# Patient Record
Sex: Male | Born: 1935 | Race: White | Hispanic: No | Marital: Married | Smoking: Former smoker
Health system: Southern US, Community
[De-identification: ages and names within clinical notes are randomized; demographics above are authoritative.]

## PROBLEM LIST (undated history)

## (undated) DIAGNOSIS — I10 Essential (primary) hypertension: Secondary | ICD-10-CM

## (undated) DIAGNOSIS — M4322 Fusion of spine, cervical region: Secondary | ICD-10-CM

## (undated) DIAGNOSIS — I38 Endocarditis, valve unspecified: Secondary | ICD-10-CM

## (undated) DIAGNOSIS — N281 Cyst of kidney, acquired: Secondary | ICD-10-CM

## (undated) DIAGNOSIS — M353 Polymyalgia rheumatica: Secondary | ICD-10-CM

## (undated) DIAGNOSIS — I4819 Other persistent atrial fibrillation: Secondary | ICD-10-CM

## (undated) DIAGNOSIS — C61 Malignant neoplasm of prostate: Secondary | ICD-10-CM

## (undated) HISTORY — DX: Polymyalgia rheumatica: M35.3

## (undated) HISTORY — DX: Other persistent atrial fibrillation: I48.19

## (undated) HISTORY — DX: Malignant neoplasm of prostate: C61

## (undated) HISTORY — PX: PROSTATECTOMY: SHX69

## (undated) HISTORY — DX: Cyst of kidney, acquired: N28.1

## (undated) HISTORY — PX: CERVICAL FUSION: SHX112

## (undated) HISTORY — DX: Endocarditis, valve unspecified: I38

## (undated) HISTORY — PX: HERNIA REPAIR: SHX51

---

## 1997-06-26 DIAGNOSIS — C61 Malignant neoplasm of prostate: Secondary | ICD-10-CM

## 1997-06-26 HISTORY — DX: Malignant neoplasm of prostate: C61

## 2003-06-27 DIAGNOSIS — M4322 Fusion of spine, cervical region: Secondary | ICD-10-CM

## 2003-06-27 HISTORY — DX: Fusion of spine, cervical region: M43.22

## 2014-11-02 ENCOUNTER — Other Ambulatory Visit: Payer: Self-pay | Admitting: Urology

## 2014-11-02 DIAGNOSIS — R310 Gross hematuria: Secondary | ICD-10-CM

## 2014-11-04 ENCOUNTER — Ambulatory Visit
Admission: RE | Admit: 2014-11-04 | Discharge: 2014-11-04 | Disposition: A | Discharge status: Home / Self Care | Payer: Medicare Other | Source: Ambulatory Visit | Attending: Urology | Admitting: Urology

## 2014-11-04 DIAGNOSIS — N281 Cyst of kidney, acquired: Secondary | ICD-10-CM | POA: Insufficient documentation

## 2014-11-04 DIAGNOSIS — K4090 Unilateral inguinal hernia, without obstruction or gangrene, not specified as recurrent: Secondary | ICD-10-CM | POA: Diagnosis not present

## 2014-11-04 DIAGNOSIS — R310 Gross hematuria: Secondary | ICD-10-CM | POA: Diagnosis present

## 2014-11-04 HISTORY — DX: Fusion of spine, cervical region: M43.22

## 2014-11-04 HISTORY — DX: Essential (primary) hypertension: I10

## 2014-11-04 MED ORDER — IOHEXOL 300 MG/ML  SOLN
125.0000 mL | Freq: Once | INTRAMUSCULAR | Status: AC | PRN
  Administered 2014-11-04: 150 mL via INTRAVENOUS

## 2015-10-27 ENCOUNTER — Other Ambulatory Visit: Payer: Self-pay | Admitting: Orthopedic Surgery

## 2015-10-27 DIAGNOSIS — M542 Cervicalgia: Secondary | ICD-10-CM

## 2015-10-27 DIAGNOSIS — M545 Low back pain: Secondary | ICD-10-CM

## 2015-11-15 ENCOUNTER — Ambulatory Visit
Admission: RE | Admit: 2015-11-15 | Discharge: 2015-11-15 | Disposition: A | Discharge status: Home / Self Care | Payer: Medicare Other | Source: Ambulatory Visit | Attending: Orthopedic Surgery | Admitting: Orthopedic Surgery

## 2015-11-15 DIAGNOSIS — M542 Cervicalgia: Secondary | ICD-10-CM

## 2015-11-15 DIAGNOSIS — M47816 Spondylosis without myelopathy or radiculopathy, lumbar region: Secondary | ICD-10-CM | POA: Insufficient documentation

## 2015-11-15 DIAGNOSIS — M47812 Spondylosis without myelopathy or radiculopathy, cervical region: Secondary | ICD-10-CM | POA: Diagnosis not present

## 2015-11-15 DIAGNOSIS — M545 Low back pain: Secondary | ICD-10-CM

## 2015-11-15 DIAGNOSIS — M4802 Spinal stenosis, cervical region: Secondary | ICD-10-CM | POA: Insufficient documentation

## 2015-11-15 DIAGNOSIS — M1288 Other specific arthropathies, not elsewhere classified, other specified site: Secondary | ICD-10-CM | POA: Diagnosis not present

## 2015-11-15 DIAGNOSIS — S129XXA Fracture of neck, unspecified, initial encounter: Secondary | ICD-10-CM | POA: Diagnosis not present

## 2015-11-15 DIAGNOSIS — M47814 Spondylosis without myelopathy or radiculopathy, thoracic region: Secondary | ICD-10-CM | POA: Insufficient documentation

## 2015-11-15 DIAGNOSIS — M546 Pain in thoracic spine: Secondary | ICD-10-CM | POA: Insufficient documentation

## 2016-11-06 ENCOUNTER — Other Ambulatory Visit: Payer: Self-pay | Admitting: Physical Medicine and Rehabilitation

## 2016-11-06 ENCOUNTER — Ambulatory Visit
Admission: RE | Admit: 2016-11-06 | Discharge: 2016-11-06 | Disposition: A | Discharge status: Home / Self Care | Payer: Medicare Other | Source: Ambulatory Visit | Attending: Physical Medicine and Rehabilitation | Admitting: Physical Medicine and Rehabilitation

## 2016-11-06 DIAGNOSIS — M4302 Spondylolysis, cervical region: Secondary | ICD-10-CM | POA: Insufficient documentation

## 2016-11-06 DIAGNOSIS — Z981 Arthrodesis status: Secondary | ICD-10-CM | POA: Insufficient documentation

## 2016-11-06 DIAGNOSIS — M542 Cervicalgia: Secondary | ICD-10-CM

## 2016-11-06 DIAGNOSIS — M4802 Spinal stenosis, cervical region: Secondary | ICD-10-CM | POA: Insufficient documentation

## 2016-11-06 DIAGNOSIS — R531 Weakness: Secondary | ICD-10-CM | POA: Diagnosis not present

## 2016-11-28 ENCOUNTER — Other Ambulatory Visit: Payer: Self-pay | Admitting: Orthopedic Surgery

## 2016-11-28 DIAGNOSIS — M67912 Unspecified disorder of synovium and tendon, left shoulder: Secondary | ICD-10-CM

## 2016-12-05 ENCOUNTER — Ambulatory Visit
Admission: RE | Admit: 2016-12-05 | Discharge: 2016-12-05 | Disposition: A | Discharge status: Home / Self Care | Payer: Medicare Other | Source: Ambulatory Visit | Attending: Orthopedic Surgery | Admitting: Orthopedic Surgery

## 2016-12-05 DIAGNOSIS — M25712 Osteophyte, left shoulder: Secondary | ICD-10-CM | POA: Insufficient documentation

## 2016-12-05 DIAGNOSIS — M19012 Primary osteoarthritis, left shoulder: Secondary | ICD-10-CM | POA: Insufficient documentation

## 2016-12-05 DIAGNOSIS — M67912 Unspecified disorder of synovium and tendon, left shoulder: Secondary | ICD-10-CM | POA: Diagnosis present

## 2017-05-19 IMAGING — MR MR LUMBAR SPINE W/O CM
4 of 5 series · 15 of 48 positions shown · non-contrast
Comparison: CT of the abdomen 11/04/2014.

CLINICAL DATA: Low back pain. RIGHT leg pain. Symptoms for many
years, now worsening.

EXAM:
MRI LUMBAR SPINE WITHOUT CONTRAST
TECHNIQUE: Multiplanar, multisequence MR imaging of the lumbar spine was
performed. No intravenous contrast was administered.

[Series 2: T2 · sagittal · 4.0mm · 0.44mm/px · 6 of 16 slices shown (1 of 2)]
[im 1/16]
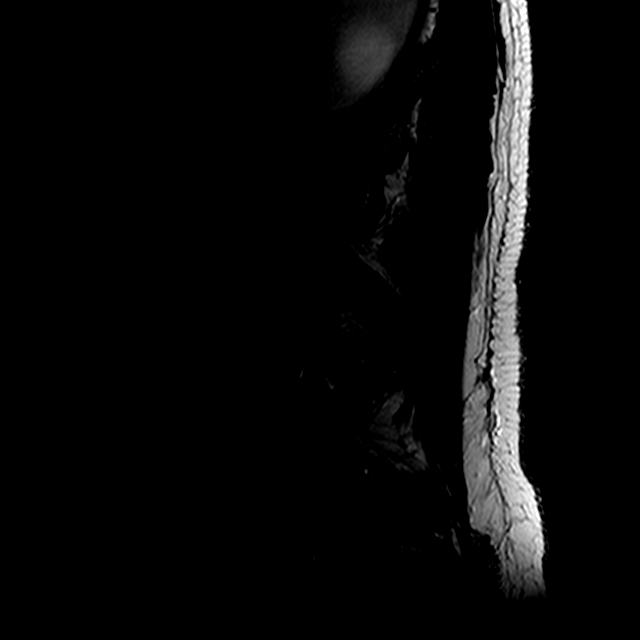
[im 4/16]
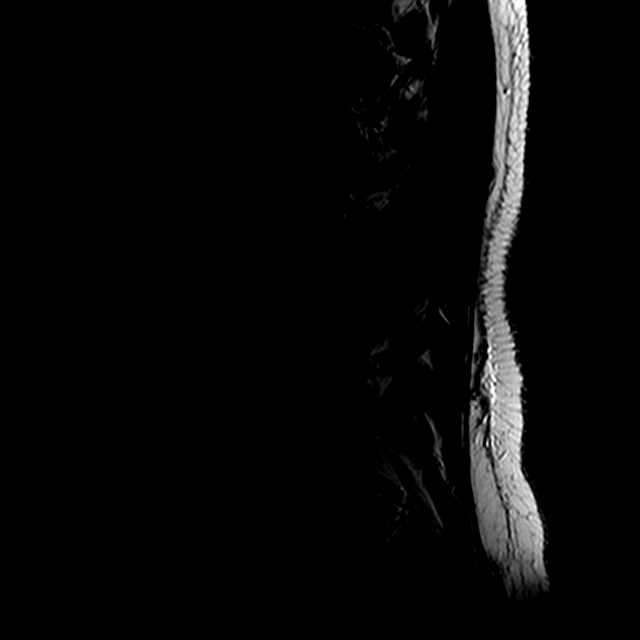
[im 7/16]
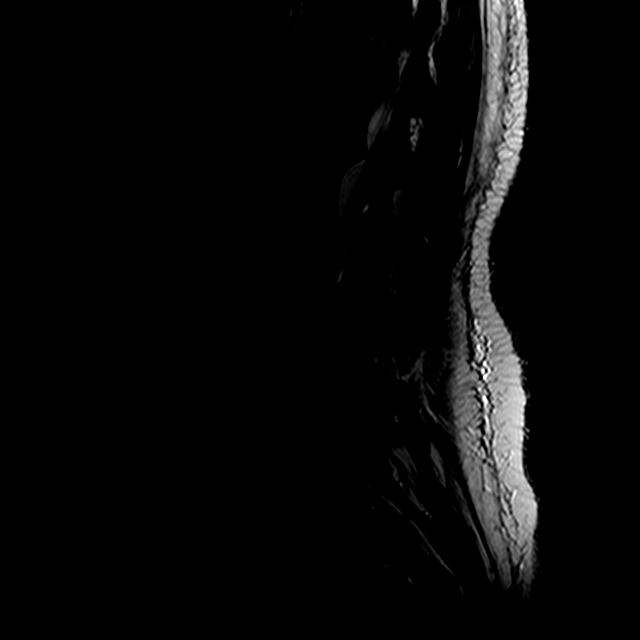
[im 10/16]
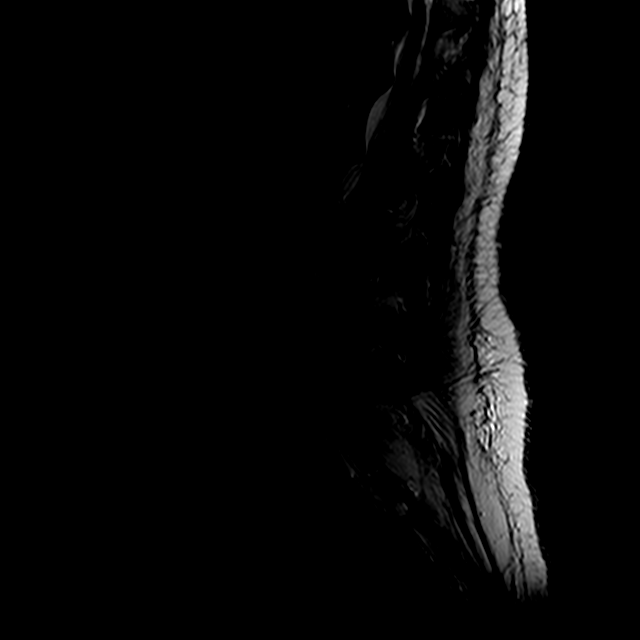
[im 13/16]
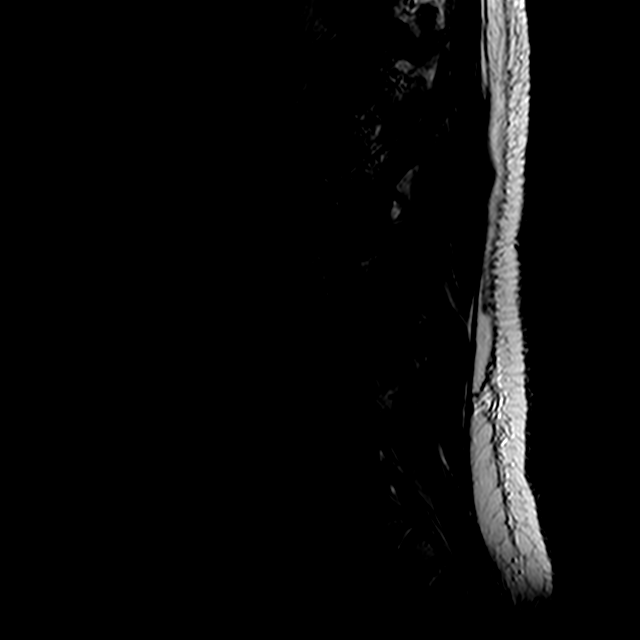
[im 16/16]
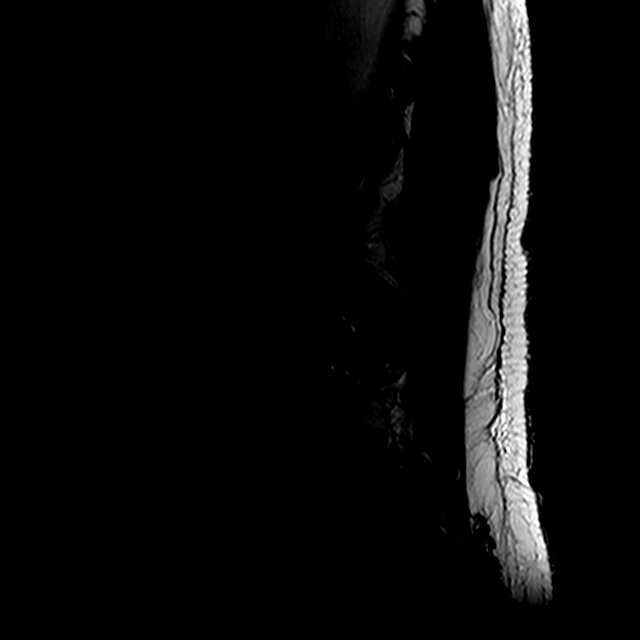

[Series 3: T1 · sagittal · 4.0mm · 0.44mm/px · 3 of 16 slices shown (1 of 2)]
[im 4/16]
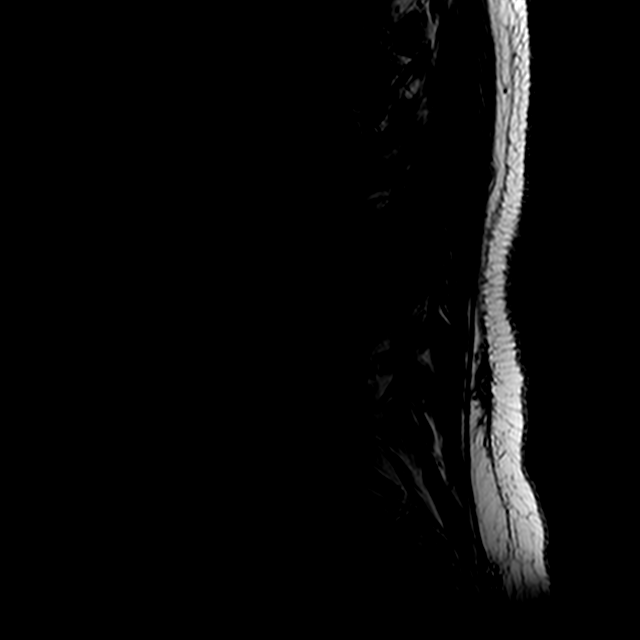
[im 10/16]
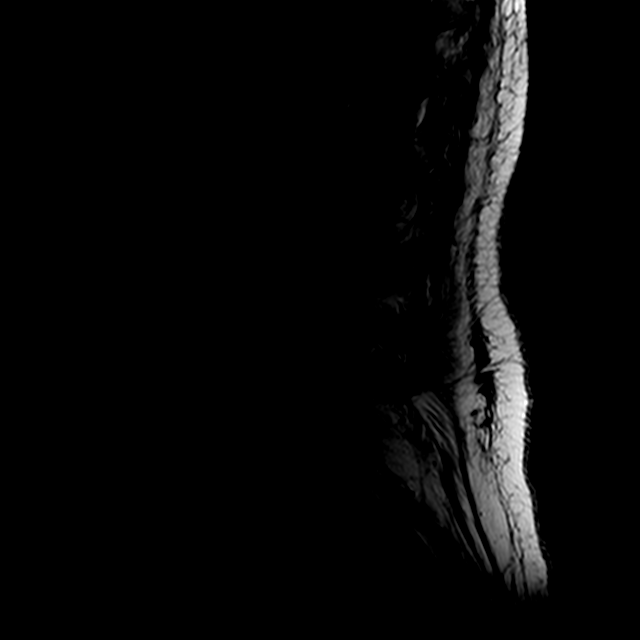
[im 16/16]
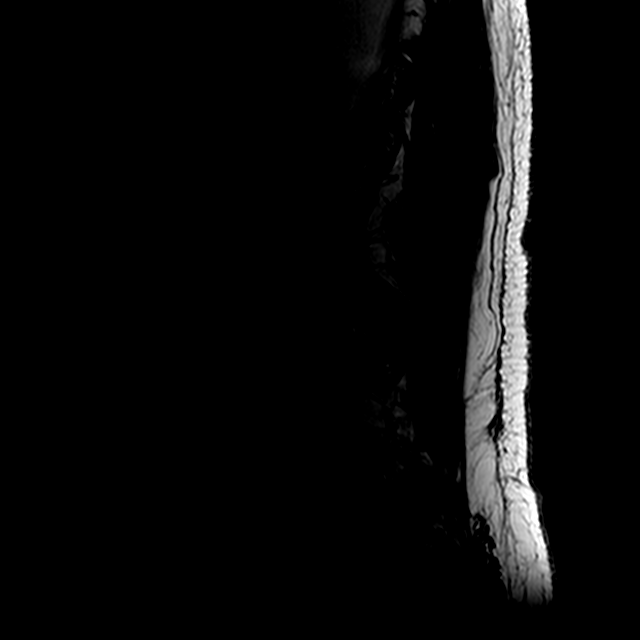

[Series 5: T2 · axial · 4.0mm · 0.39mm/px · z∈[-41,+131]mm · 3 of 38 slices shown (2 of 2)]
[im 6/38]
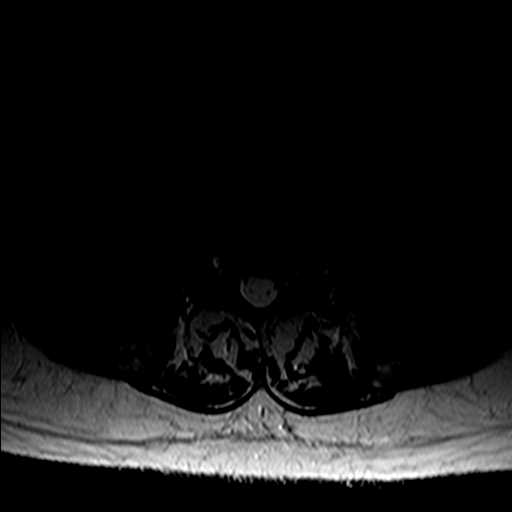
[im 19/38]
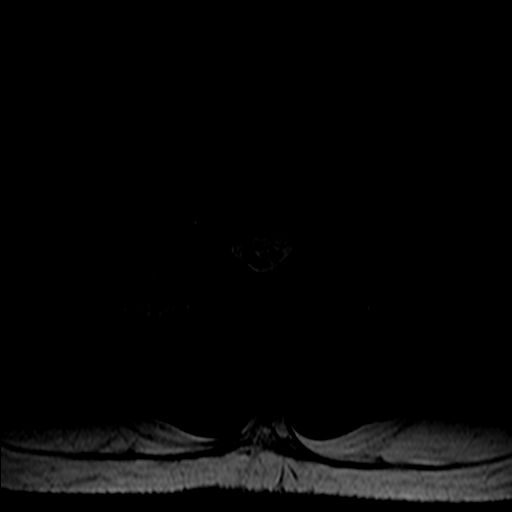
[im 32/38]
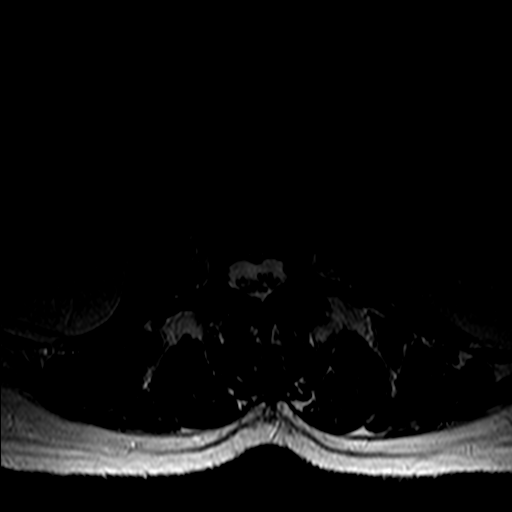

[Series 6: T1 · axial · 4.0mm · 0.39mm/px · z∈[-41,+131]mm · 3 of 38 slices shown (2 of 2)]
[im 6/38]
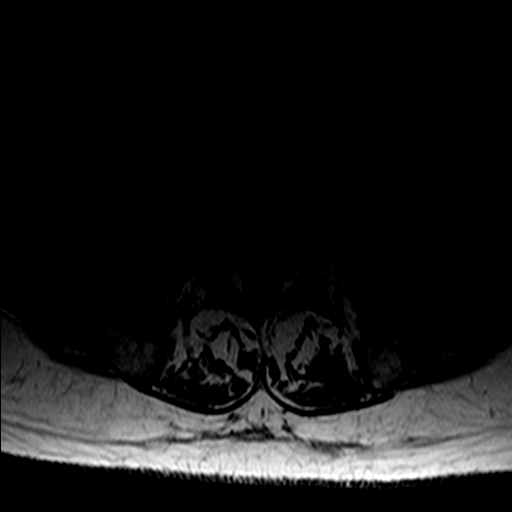
[im 19/38]
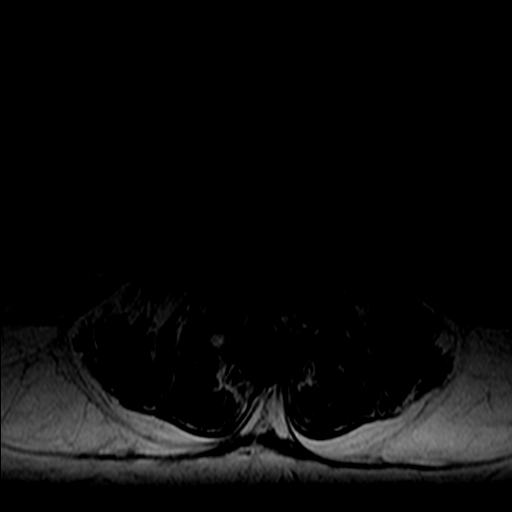
[im 32/38]
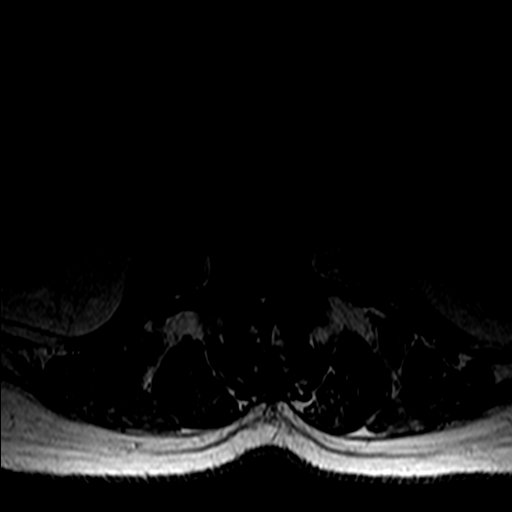

[15 of 48 positions shown; findings below may reference images not displayed]

FINDINGS: Segmentation:  Normal.

Alignment:  3 mm anterolisthesis at L5-S1 is facet mediated.

Vertebrae: No worrisome osseous lesion. Schmorl's nodes project
inferiorly from L1-2 and superiorly from L2-3.

Conus medullaris: Extends to the L1 level and appears normal.

Paraspinal and other soft tissues: Unremarkable

Disc levels:

L1-L2: Mild disc space narrowing. Central protrusion. Posterior
element hypertrophy. Mild stenosis. No definite subarticular zone or
foraminal zone narrowing.

L2-L3: Mild disc space narrowing. Central protrusion. Advanced
posterior element hypertrophy. Severe stenosis. LEFT greater than
RIGHT subarticular zone narrowing. Mild BILATERAL foraminal
narrowing.

L3-L4: Mild disc space narrowing. Annular bulging. Posterior element
hypertrophy. Moderate to severe stenosis. BILATERAL subarticular
zone and foraminal zone narrowing.

L4-L5: Central protrusion. Posterior element hypertrophy. Mild
stenosis. RIGHT greater than LEFT subarticular zone and foraminal
zone narrowing.

L5-S1: 3 mm anterolisthesis. Annular bulging. Marked posterior
element hypertrophy. No subarticular zone narrowing, but BILATERAL
foraminal narrowing is prominent, worse on the RIGHT.
IMPRESSION: Multilevel spondylosis as described.

With regard to RIGHT leg symptoms, potentially symptomatic neural
impingement is observed at multiple levels, from L2-3 through L5-S1.

## 2018-01-11 ENCOUNTER — Encounter: Payer: Self-pay | Admitting: Cardiovascular Disease

## 2018-01-11 ENCOUNTER — Telehealth: Payer: Self-pay | Admitting: Cardiovascular Disease

## 2018-01-11 ENCOUNTER — Ambulatory Visit (INDEPENDENT_AMBULATORY_CARE_PROVIDER_SITE_OTHER): Payer: Medicare Other | Admitting: Cardiovascular Disease

## 2018-01-11 ENCOUNTER — Encounter: Payer: Self-pay | Admitting: *Deleted

## 2018-01-11 VITALS — BP 179/91 | HR 69 | Ht 71.0 in | Wt 208.5 lb

## 2018-01-11 DIAGNOSIS — I10 Essential (primary) hypertension: Secondary | ICD-10-CM

## 2018-01-11 MED ORDER — CARVEDILOL 6.25 MG PO TABS: 6.2500 mg | ORAL_TABLET | Freq: Two times a day (BID) | ORAL | 0 refills | Status: DC

## 2018-01-11 NOTE — Progress Notes (Signed)
Cardiology Office Note   Date:  01/11/2018   ID:  Ian Larsen, DOB 1934-07-27, MRN [id number]  PCP:  Antony Contras, MD  Cardiologist:   Kathlyn Sacramento, MD   Chief Complaint  Patient presents with  . OTHER    Hypertension. Meds reviewed verbally with pt.      History of Present Illness: Ian Larsen is a 83 y.o. male who was referred by Dr. Moreen Fowler for evaluation of difficult to control hypertension.  He has known history of essential hypertension, spinal stenosis, prostate cancer and borderline diabetes. He was diagnosed with essential hypertension in 1995.  For many years, his blood pressure was reasonably controlled.  However, over the last year his blood pressure became extremely high and difficult to control in spite of adding new medications.  He denies any chest pain or shortness of breath.  He is very active.  Recent labs in May showed unremarkable CBC.  Creatinine was 0.99 with a potassium of 4.5.  TSH was normal. He is currently on carvedilol, amlodipine as well as Diovan-hydrochlorothiazide.  He has history of spinal stenosis status post cervical spine surgery in 2006.  He used to take ibuprofen to control his pain but has not used these kind of medications in the last 6 to 12 months. He has no history of sleep apnea.  He was told by his wife that he snores loudly but is not aware of apnea episodes.  He feels that he has good quality sleep overall. He quit smoking about 40 years ago.  No excessive alcohol use.   Past Medical History:  Diagnosis Date  . Cancer [facility]) 1999   Prostate CA with radical prostatectomy and radical lymph node resections.   . Fusion of spine of cervical region 2005  . Hypertension     Past Surgical History:  Procedure Laterality Date  . CERVICAL FUSION    . HERNIA REPAIR    . PROSTATECTOMY       Current Outpatient Medications  Medication Sig Dispense Refill  . amLODipine (NORVASC) 5 MG tablet Take 5 mg by mouth daily.    . carvedilol  (COREG) 3.125 MG tablet Take 3.125 mg by mouth 2 (two) times daily with a meal.    . Cholecalciferol (VITAMIN D) 2000 units tablet Take 2,000 Units by mouth daily.    . Coenzyme Q10 (CO Q-10) 100 MG CAPS Take by mouth daily.    . Magnesium 100 MG CAPS Take by mouth daily.    . Misc Natural Products (OSTEO BI-FLEX JOINT SHIELD PO) Take by mouth daily.    . Multiple Vitamin (MULTIVITAMIN) capsule Take 1 capsule by mouth daily.    Marland Kitchen OLIVE LEAF PO Take by mouth daily.    . Omega-3 Fatty Acids (FISH OIL PO) Take by mouth daily.    . TURMERIC CURCUMIN PO Take by mouth daily.    . valsartan-hydrochlorothiazide (DIOVAN-HCT) 320-25 MG tablet Take 1 tablet by mouth daily.     No current facility-administered medications for this visit.     Allergies:   Tape; Vicodin [hydrocodone-acetaminophen]; and Vinegar [acetic acid]    Social History:  The patient  reports that he has quit smoking. His smoking use included cigarettes. He quit after 1.00 year of use. He has never used smokeless tobacco. He reports that he does not drink alcohol or use drugs.   Family History:  The patient's family history includes Heart Problems in his mother; Hypertension in his brother and sister.    ROS:  Please see the history of present illness.   Otherwise, review of systems are positive for none.   All other systems are reviewed and negative.    PHYSICAL EXAM: VS:  BP (!) 179/91 (BP Location: Left Arm, Patient Position: Sitting, Cuff Size: Normal)   Ht 5\' 11"  (1.803 m)   Wt 208 lb 8 oz (94.6 kg)   BMI 29.08 kg/m  , BMI Body mass index is 29.08 kg/m. GEN: Well nourished, well developed, in no acute distress  HEENT: normal  Neck: no JVD, carotid bruits, or masses Cardiac: RRR; no murmurs, rubs, or gallops,no edema  Respiratory:  clear to auscultation bilaterally, normal work of breathing GI: soft, nontender, nondistended, + BS MS: no deformity or atrophy  Skin: warm and dry, no rash Neuro:  Strength and  sensation are intact Psych: euthymic mood, full affect   EKG:  EKG is ordered today. The ekg ordered today demonstrates sinus rhythm with first-degree AV block, left axis deviation and nonspecific IVCD.   Recent Labs: No results found for requested labs within last 8760 hours.    Lipid Panel No results found for: CHOL, TRIG, HDL, CHOLHDL, VLDL, LDLCALC, LDLDIRECT    Wt Readings from Last 3 Encounters:  01/11/18 208 lb 8 oz (94.6 kg)  11/04/14 226 lb (102.5 kg)       PAD Screen 01/11/2018  Previous PAD dx? No  Previous surgical procedure? No  Pain with walking? Yes  Subsides with rest? No  Feet/toe relief with dangling? No  Painful, non-healing ulcers? No  Extremities discolored? No      ASSESSMENT AND PLAN:  1.  Refractory hypertension: The patient's blood pressure has been controlled for many years up until last year when his blood pressure became difficult to control.  This raises the possibility of secondary hypertension.  He is currently not using any NSAIDs.  There is nothing to strongly suggest sleep apnea.  Hyperaldosteronism is unlikely in his age group.  I do think we have to exclude the possibility of atherosclerotic renal artery stenosis.  Thus, I requested renal artery duplex. I increased carvedilol to 6.25 mg twice daily. If blood pressure remains elevated without explanation, I think the next step is to add spironolactone.     Disposition:   FU with me in 1 month  Signed,  Kathlyn Sacramento, MD  01/11/2018 11:54 AM    [place]

## 2018-01-11 NOTE — Telephone Encounter (Signed)
Pharmacy has been updated to [facility] for long term medications.

## 2018-01-11 NOTE — Telephone Encounter (Signed)
Pt calling to let us know he uses [facility] as his mail order

## 2018-01-11 NOTE — Patient Instructions (Signed)
Medication Instructions: INCREASE the Carvedilol to 6.25 mg twice daily  If you need a refill on your cardiac medications before your next appointment, please call your pharmacy.    Procedures/Testing: Your physician has requested that you have a renal artery duplex. During this test, an ultrasound is used to evaluate blood flow to the kidneys. Allow one hour for this exam. Do not eat after midnight the day before and avoid carbonated beverages. Take your medications as you usually do.   Follow-Up: Your physician wants you to follow-up in one month with Dr. Fletcher Anon.   Thank you for choosing [facility]!

## 2018-01-18 ENCOUNTER — Ambulatory Visit (INDEPENDENT_AMBULATORY_CARE_PROVIDER_SITE_OTHER): Payer: Medicare Other

## 2018-01-18 DIAGNOSIS — I10 Essential (primary) hypertension: Secondary | ICD-10-CM

## 2018-01-21 ENCOUNTER — Other Ambulatory Visit: Payer: Self-pay | Admitting: *Deleted

## 2018-01-21 MED ORDER — CARVEDILOL 6.25 MG PO TABS: 6.2500 mg | ORAL_TABLET | Freq: Two times a day (BID) | ORAL | 3 refills | Status: DC

## 2018-01-21 NOTE — Progress Notes (Signed)
Patient called stating that he would like for his Carvedilol to be cancelled at [facility] and sent into [facility]. This has been completed for him.

## 2018-02-26 ENCOUNTER — Encounter: Payer: Self-pay | Admitting: Nurse Practitioner

## 2018-02-26 ENCOUNTER — Ambulatory Visit: Payer: Medicare Other | Admitting: Nurse Practitioner

## 2018-02-26 VITALS — BP 162/90 | HR 66 | Ht 71.0 in | Wt 208.2 lb

## 2018-02-26 DIAGNOSIS — I10 Essential (primary) hypertension: Secondary | ICD-10-CM | POA: Diagnosis not present

## 2018-02-26 MED ORDER — AMLODIPINE BESYLATE 10 MG PO TABS: 10.0000 mg | ORAL_TABLET | Freq: Every day | ORAL | 3 refills | Status: DC

## 2018-02-26 NOTE — Patient Instructions (Signed)
Medication Instructions:  Your physician has recommended you make the following change in your medication:  1- INCREASE Amlodipine to 10 mg by mouth once a day.   Labwork: none  Testing/Procedures: none  Follow-Up: Your physician recommends that you schedule a follow-up appointment in: 2-3 [place].   Any Other Special Instructions Will Be Listed Below (If Applicable).  Your physician has requested that you regularly monitor and record your blood pressure readings and heart rate at home. Please use the same machine at about the same time each day about 2 hours after taking medications.   Please call us in 1 week with the readings.   If you need a refill on your cardiac medications before your next appointment, please call your pharmacy.

## 2018-02-26 NOTE — Progress Notes (Signed)
Office Visit    Patient Name: Ian Larsen Date of Encounter: 02/26/2018  Primary Care Provider:  Antony Contras, MD Primary Cardiologist:  Kathlyn Sacramento, MD  Chief Complaint    83 year old male with a history of spinal stenosis, prostate cancer, and borderline diabetes, who presents for follow-up related to ongoing hypertension.  Past Medical History    Past Medical History:  Diagnosis Date  . Fusion of spine of cervical region 2005  . Hypertension    a. 12/2017 Renal artery duplex: No renal artery stenosis. Nl Celiac and SMA artery findings.  . Prostate cancer ([place]) 1999   a. 1999 s/p radical prostatectomy and radical lymph node resections.   . Renal cyst, right    a. 12/2017 Renal Duplex: incidental finding of 2.3 x 2.1 cm avascular cystic area in upper pole and 7.2 x 6.2 x 6.5 cm avascular cyst in upper-posterior portion of right kidney.   Past Surgical History:  Procedure Laterality Date  . CERVICAL FUSION    . HERNIA REPAIR    . PROSTATECTOMY      Allergies  Allergies  Allergen Reactions  . Fluviral [Flu Virus Vaccine]   . Neomycin Other (See Comments)  . Tape Dermatitis  . Vicodin [Hydrocodone-Acetaminophen] Nausea And Vomiting    Patient states this made him dizzy and felt horrible.  Lyla Son [Acetic Acid] Rash    History of Present Illness    83 year old male with the above past medical history including hypertension, spinal stenosis, prostate cancer, and borderline diabetes.  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  He was diagnosed with hypertension in 1995 and notes that he had what was deemed is reasonable control with pressures in the 140s over the years but when he moved to [place], he was encouraged to get better control of his blood pressure.  He was seen by Dr. Fletcher Anon in July and at that time, his carvedilol was increased to 6.25 mg twice daily.  His blood pressure was 179/91 that day.  A renal artery  duplex was also performed and showed no evidence of renal artery stenosis.  Right renal cysts were noted, which patient says had previously been noted by nephrology in [place].  He checks his blood pressure a few times a month and has been running in the 140-150 range.  He is fairly active, exercising daily.  He does not experience chest pain, dyspnea, palpitations, PND, orthopnea, dizziness, syncope, edema, or early satiety.  Home Medications    Prior to Admission medications   Medication Sig Start Date End Date Taking? Authorizing Provider  amLODipine (NORVASC) 5 MG tablet Take 5 mg by mouth daily.    [provider]  carvedilol (COREG) 6.25 MG tablet Take 1 tablet (6.25 mg total) by mouth 2 (two) times daily with a meal. 01/21/18   Wellington Hampshire, MD  Cholecalciferol (VITAMIN D) 2000 units tablet Take 2,000 Units by mouth daily.    [provider]  Coenzyme Q10 (CO Q-10) 100 MG CAPS Take by mouth daily.    [provider]  Magnesium 100 MG CAPS Take by mouth daily.    [provider]  Misc Natural Products (OSTEO BI-FLEX JOINT SHIELD PO) Take by mouth daily.    [provider]  Multiple Vitamin (MULTIVITAMIN) capsule Take 1 capsule by mouth daily.    [provider]  OLIVE LEAF PO Take by mouth daily.    [provider]  Omega-3 Fatty Acids ([place]  OIL PO) Take by mouth daily.    [provider]  TURMERIC CURCUMIN PO Take by mouth daily.    [provider]  valsartan-hydrochlorothiazide (DIOVAN-HCT) 320-25 MG tablet Take 1 tablet by mouth daily.    [provider]    Review of Systems    He denies chest pain, palpitations, dyspnea, pnd, orthopnea, n, v, dizziness, syncope, edema, weight gain, or early satiety.  All other systems reviewed and are otherwise negative except as noted above.  Physical Exam    VS:  BP (!) 162/90 (BP Location: Left Arm, Patient Position: Sitting, Cuff Size: Large)    Pulse 66   Ht 5\' 11"  (1.803 m)   Wt 208 lb 4 oz (94.5 kg)   BMI 29.04 kg/m  , BMI Body mass index is 29.04 kg/m. GEN: Well nourished, well developed, in no acute distress. HEENT: normal. Neck: Supple, no JVD, carotid bruits, or masses. Cardiac: RRR, no murmurs, rubs, or gallops. No clubbing, cyanosis, edema.  Radials/DP/PT 2+ and equal bilaterally.  Respiratory:  Respirations regular and unlabored, clear to auscultation bilaterally. GI: Soft, nontender, nondistended, BS + x 4. MS: no deformity or atrophy. Skin: warm and dry, no rash. Neuro:  Strength and sensation are intact. Psych: Normal affect.  Accessory Clinical Findings    ECG personally reviewed by me today -regular sinus rhythm, 66, first-degree AV block, left axis deviation, left anterior fascicular block, IVCD- no acute changes.  Assessment & Plan    1.  Essential hypertension: Patient with a long history of hypertension was recently evaluated in July and carvedilol was increased.  Pressures at home have been in the 140s.  We discussed options for management today including Dr. Tyrell Antonio recommendation to add spironolactone.  Patient has a history of prostate cancer and already takes HCTZ and has some concern that additional diuretic may resulted in more polyuria and nocturia.  We have agreed to titrate amlodipine to 10 mg daily today and he will check his blood pressures daily over the next week and call us in 1 week.  If pressures are persistently greater than 130, at that point, he would be agreeable to initiating Spironolactone 12.5 mg daily.  If we go that route, he will need a follow-up blood chemistry within a week.  2.  Disposition: He will call us in 1 week with blood pressure report.  Plan to follow-up in 2 to 3 months otherwise.  Murray Hodgkins, NP 02/26/2018, 12:30 PM

## 2018-03-07 ENCOUNTER — Telehealth: Payer: Self-pay | Admitting: Cardiovascular Disease

## 2018-03-07 NOTE — Telephone Encounter (Signed)
Patient dropped off list of BP readings Placed in nurse box

## 2018-03-08 NOTE — Telephone Encounter (Signed)
Pressure trend appears to be better.  More 130's, less 140's.  As he has had some lightheadedness, I think we should hold off on adjusting any further/adding another agent at this time.  Cont current regimen and continue to record bp's.  If he start to see bp creeping back up into the 140-150 range consistently, than he should contact us, as we'd likely add spironolactone at that point.

## 2018-03-08 NOTE — Telephone Encounter (Signed)
Left a message for the patient to call back.  

## 2018-03-08 NOTE — Telephone Encounter (Signed)
Patient returning our call

## 2018-03-08 NOTE — Telephone Encounter (Signed)
Reported BP (HR) readings per patient:  01/12/18- started coreg 6.25 mg BID 7/24- 136/83 (68) 8/8- 130/76 (61) 8/18- 140/84 (64) 8/27- 149/79 (64) 8/31- 147/84 (65) 9/2- 162/90 (66) - "f/u visit at the office amlodipine went from 5>>10 mg 9/3- started amlodipine 10 mg QD 9/4- 126/79 (67) 9/5- 139/77 (64) 9/6- 137/76 (64) 9/7- 139/75 (65) 9/8- 143/88 (67) 9/9- 150/87 (60) 9/10- 136/72 (65) 9/11- 134/74 (62)  Per the patient, he had comments on his paperwork from his BP readings for September "a little light headed about 30 minute after taking passes in 20-30 minutes."  The patient saw Ignacia Bayley, NP on 02/26/18. To Gerald Stabs to review.

## 2018-03-11 NOTE — Telephone Encounter (Signed)
Patient made aware and will call back if blood pressures consistently stay in the 140-150 range.

## 2018-05-30 ENCOUNTER — Ambulatory Visit: Payer: Medicare Other | Admitting: Cardiovascular Disease

## 2018-05-30 ENCOUNTER — Encounter: Payer: Self-pay | Admitting: Cardiovascular Disease

## 2018-05-30 VITALS — BP 168/84 | HR 70 | Ht 71.0 in | Wt 214.0 lb

## 2018-05-30 DIAGNOSIS — I10 Essential (primary) hypertension: Secondary | ICD-10-CM

## 2018-05-30 MED ORDER — AMLODIPINE BESYLATE 10 MG PO TABS: 10.0000 mg | ORAL_TABLET | Freq: Every day | ORAL | 3 refills | Status: DC

## 2018-05-30 NOTE — Progress Notes (Signed)
Cardiology Office Note   Date:  05/30/2018   ID:  [name], DOB [birth year], MRN [id number]  PCP:  Antony Contras, MD  Cardiologist:   Kathlyn Sacramento, MD   Chief Complaint  Patient presents with  . other    3 mo follow up. Medications reviewed verbally.       History of Present Illness: Ian Larsen is a 83 y.o. male who is here today for follow-up visit regarding refractory hypertension.    He has known history of essential hypertension, spinal stenosis, prostate cancer and borderline diabetes. He was diagnosed with essential hypertension in 1995.  For many years, his blood pressure was reasonably controlled.  However, his blood pressure became difficult to control in 2019.   He has history of spinal stenosis status post cervical spine surgery in 2006.  He has no history of sleep apnea.  During last visit, increase his carvedilol and scheduled him for renal artery duplex which showed no evidence of renal artery stenosis.  His amlodipine was increased to 10 mg daily.  He has been doing very well with no recent chest pain, shortness of breath or palpitations.  No leg edema.  He has been checking his blood pressure regularly at home and most of the time his systolic blood pressure is below 130 mmHg.  Past Medical History:  Diagnosis Date  . Fusion of spine of cervical region 2005  . Hypertension    a. 12/2017 Renal artery duplex: No renal artery stenosis. Nl Celiac and SMA artery findings.  . Prostate cancer ([place]) 1999   a. 1999 s/p radical prostatectomy and radical lymph node resections.   . Renal cyst, right    a. 12/2017 Renal Duplex: incidental finding of 2.3 x 2.1 cm avascular cystic area in upper pole and 7.2 x 6.2 x 6.5 cm avascular cyst in upper-posterior portion of right kidney.    Past Surgical History:  Procedure Laterality Date  . CERVICAL FUSION    . HERNIA REPAIR    . PROSTATECTOMY       Current Outpatient Medications  Medication Sig Dispense Refill  .  amLODipine (NORVASC) 10 MG tablet Take 1 tablet (10 mg total) by mouth daily. 90 tablet 3  . carvedilol (COREG) 6.25 MG tablet Take 1 tablet (6.25 mg total) by mouth 2 (two) times daily with a meal. 180 tablet 3  . Cholecalciferol (VITAMIN D) 2000 units tablet Take 2,000 Units by mouth daily.    . Coenzyme Q10 (CO Q-10) 100 MG CAPS Take by mouth daily.    . Magnesium 100 MG CAPS Take by mouth daily.    . Misc Natural Products (OSTEO BI-FLEX JOINT SHIELD PO) Take by mouth daily.    . Multiple Vitamin (MULTIVITAMIN) capsule Take 1 capsule by mouth daily.    Marland Kitchen OLIVE LEAF PO Take by mouth daily.    . Omega-3 Fatty Acids (FISH OIL PO) Take by mouth daily.    . TURMERIC CURCUMIN PO Take by mouth daily.    . valsartan-hydrochlorothiazide (DIOVAN-HCT) 320-25 MG tablet Take 1 tablet by mouth daily.     No current facility-administered medications for this visit.     Allergies:   Fluviral [flu virus vaccine]; Neomycin; Tape; Vicodin [hydrocodone-acetaminophen]; and Vinegar [acetic acid]    Social History:  The patient  reports that he has quit smoking. His smoking use included cigarettes. He quit after 1.00 year of use. He has never used smokeless tobacco. He reports that he does not drink alcohol or  use drugs.   Family History:  The patient's family history includes Heart Problems in his mother; Hypertension in his brother and sister.    ROS:  Please see the history of present illness.   Otherwise, review of systems are positive for none.   All other systems are reviewed and negative.    PHYSICAL EXAM: VS:  BP (!) 168/84   Pulse 70   Ht 5\' 11"  (1.803 m)   Wt 214 lb (97.1 kg)   BMI 29.85 kg/m  , BMI Body mass index is 29.85 kg/m. GEN: Well nourished, well developed, in no acute distress  HEENT: normal  Neck: no JVD, carotid bruits, or masses Cardiac: RRR; no murmurs, rubs, or gallops,no edema  Respiratory:  clear to auscultation bilaterally, normal work of breathing GI: soft, nontender,  nondistended, + BS MS: no deformity or atrophy  Skin: warm and dry, no rash Neuro:  Strength and sensation are intact Psych: euthymic mood, full affect   EKG:  EKG is ordered today. The ekg ordered today demonstrates normal sinus rhythm with left anterior fascicular block.  Recent Labs: No results found for requested labs within last 8760 hours.    Lipid Panel No results found for: CHOL, TRIG, HDL, CHOLHDL, VLDL, LDLCALC, LDLDIRECT    Wt Readings from Last 3 Encounters:  05/30/18 214 lb (97.1 kg)  02/26/18 208 lb 4 oz (94.5 kg)  01/11/18 208 lb 8 oz (94.6 kg)       PAD Screen 01/11/2018  Previous PAD dx? No  Previous surgical procedure? No  Pain with walking? Yes  Subsides with rest? No  Feet/toe relief with dangling? No  Painful, non-healing ulcers? No  Extremities discolored? No      ASSESSMENT AND PLAN:  1.  Refractory hypertension: No evidence of secondary hypertension.  Blood pressure is much more controlled since increasing Coreg and amlodipine.   His home blood pressure readings are much better than here and he might have a component of whitecoat syndrome.  I made no changes in his medications today. Small dose spironolactone can be considered in the future if needed. I advised him to exercise at least 30 minutes daily.    Disposition:   FU with me in 6 months  Signed,  Kathlyn Sacramento, MD  05/30/2018 11:14 AM    [place]

## 2018-05-30 NOTE — Patient Instructions (Signed)

## 2018-11-21 ENCOUNTER — Telehealth: Payer: Self-pay | Admitting: Cardiovascular Disease

## 2018-11-21 ENCOUNTER — Telehealth: Payer: Self-pay

## 2018-11-21 NOTE — Telephone Encounter (Signed)
Left v/m message requesting for patient to call back to switch 11/28/2018 face to face visit with Dr. Fletcher Anon to a telehealth visit.

## 2018-11-21 NOTE — Telephone Encounter (Signed)
Virtual Visit Pre-Appointment Phone Call  "(Name), I am calling you today to discuss your upcoming appointment. We are currently trying to limit exposure to the virus that causes COVID-19 by seeing patients at home rather than in the office."  1. "What is the BEST phone number to call the day of the visit?" - include this in appointment notes  2. Do you have or have access to (through a family member/friend) a smartphone with video capability that we can use for your visit?" a. If yes - list this number in appt notes as cell (if different from BEST phone #) and list the appointment type as a VIDEO visit in appointment notes b. If no - list the appointment type as a PHONE visit in appointment notes  3. Confirm consent - "In the setting of the current Covid19 crisis, you are scheduled for a (phone or video) visit with your provider on (date) at (time).  Just as we do with many in-office visits, in order for you to participate in this visit, we must obtain consent.  If you'd like, I can send this to your mychart (if signed up) or email for you to review.  Otherwise, I can obtain your verbal consent now.  All virtual visits are billed to your insurance company just like a normal visit would be.  By agreeing to a virtual visit, we'd like you to understand that the technology does not allow for your provider to perform an examination, and thus may limit your provider's ability to fully assess your condition. If your provider identifies any concerns that need to be evaluated in person, we will make arrangements to do so.  Finally, though the technology is pretty good, we cannot assure that it will always work on either your or our end, and in the setting of a video visit, we may have to convert it to a phone-only visit.  In either situation, we cannot ensure that we have a secure connection.  Are you willing to proceed?" STAFF: Did the patient verbally acknowledge consent to telehealth visit? Document  YES/NO here: YES  4. Advise patient to be prepared - "Two hours prior to your appointment, go ahead and check your blood pressure, pulse, oxygen saturation, and your weight (if you have the equipment to check those) and write them all down. When your visit starts, your provider will ask you for this information. If you have an Apple Watch or Kardia device, please plan to have heart rate information ready on the day of your appointment. Please have a pen and paper handy nearby the day of the visit as well."  5. Give patient instructions for MyChart download to smartphone OR Doximity/Doxy.me as below if video visit (depending on what platform provider is using)  6. Inform patient they will receive a phone call 15 minutes prior to their appointment time (may be from unknown caller ID) so they should be prepared to answer    TELEPHONE CALL NOTE  Ian Larsen has been deemed a candidate for a follow-up tele-health visit to limit community exposure during the Covid-19 pandemic. I spoke with the patient via phone to ensure availability of phone/video source, confirm preferred email & phone number, and discuss instructions and expectations.  I reminded Ian Larsen to be prepared with any vital sign and/or heart rhythm information that could potentially be obtained via home monitoring, at the time of his visit. I reminded Ian Larsen to expect a phone call prior to his visit.  [name] 11/21/2018 4:49 PM   INSTRUCTIONS FOR DOWNLOADING THE MYCHART APP TO SMARTPHONE  - The patient must first make sure to have activated MyChart and know their login information - If Apple, go to [employer] and type in MyChart in the search bar and download the app. If Android, ask patient to go to [employer] and type in [place] in the search bar and download the app. The app is free but as with any other app downloads, their phone may require them to verify saved payment information or Apple/Android  password.  - The patient will need to then log into the app with their MyChart username and password, and select [facility] as their healthcare provider to link the account. When it is time for your visit, go to the MyChart app, find appointments, and click Begin Video Visit. Be sure to Select Allow for your device to access the Microphone and Camera for your visit. You will then be connected, and your provider will be with you shortly.  **If they have any issues connecting, or need assistance please contact MyChart service desk [phone])**  **If using a computer, in order to ensure the best quality for their visit they will need to use either of the following Internet Browsers: [employer], or Google Chrome**  IF USING DOXIMITY or DOXY.ME - The patient will receive a link just prior to their visit by text.     FULL LENGTH CONSENT FOR TELE-HEALTH VISIT   I hereby voluntarily request, consent and authorize [facility] and its employed or contracted physicians, physician assistants, nurse practitioners or other licensed health care professionals (the Practitioner), to provide me with telemedicine health care services (the Services") as deemed necessary by the treating Practitioner. I acknowledge and consent to receive the Services by the Practitioner via telemedicine. I understand that the telemedicine visit will involve communicating with the Practitioner through live audiovisual communication technology and the disclosure of certain medical information by electronic transmission. I acknowledge that I have been given the opportunity to request an in-person assessment or other available alternative prior to the telemedicine visit and am voluntarily participating in the telemedicine visit.  I understand that I have the right to withhold or withdraw my consent to the use of telemedicine in the course of my care at any time, without affecting my right to future care or treatment,  and that the Practitioner or I may terminate the telemedicine visit at any time. I understand that I have the right to inspect all information obtained and/or recorded in the course of the telemedicine visit and may receive copies of available information for a reasonable fee.  I understand that some of the potential risks of receiving the Services via telemedicine include:   Delay or interruption in medical evaluation due to technological equipment failure or disruption;  Information transmitted may not be sufficient (e.g. poor resolution of images) to allow for appropriate medical decision making by the Practitioner; and/or   In rare instances, security protocols could fail, causing a breach of personal health information.  Furthermore, I acknowledge that it is my responsibility to provide information about my medical history, conditions and care that is complete and accurate to the best of my ability. I acknowledge that Practitioner's advice, recommendations, and/or decision may be based on factors not within their control, such as incomplete or inaccurate data provided by me or distortions of diagnostic images or specimens that may result from electronic transmissions. I understand that the practice  of medicine is not an Chief Strategy Officer and that Practitioner makes no warranties or guarantees regarding treatment outcomes. I acknowledge that I will receive a copy of this consent concurrently upon execution via email to the email address I last provided but may also request a printed copy by calling the office of [facility].    I understand that my insurance will be billed for this visit.   I have read or had this consent read to me.  I understand the contents of this consent, which adequately explains the benefits and risks of the Services being provided via telemedicine.   I have been provided ample opportunity to ask questions regarding this consent and the Services and have had my questions  answered to my satisfaction.  I give my informed consent for the services to be provided through the use of telemedicine in my medical care  By participating in this telemedicine visit I agree to the above.

## 2018-11-28 ENCOUNTER — Encounter: Payer: Self-pay | Admitting: Cardiovascular Disease

## 2018-11-28 ENCOUNTER — Telehealth (INDEPENDENT_AMBULATORY_CARE_PROVIDER_SITE_OTHER): Payer: Medicare Other | Admitting: Cardiovascular Disease

## 2018-11-28 ENCOUNTER — Other Ambulatory Visit: Payer: Self-pay

## 2018-11-28 VITALS — BP 134/79 | HR 70 | Ht 71.0 in | Wt 207.0 lb

## 2018-11-28 DIAGNOSIS — I10 Essential (primary) hypertension: Secondary | ICD-10-CM | POA: Diagnosis not present

## 2018-11-28 NOTE — Patient Instructions (Signed)
Medication Instructions:  Continue same medications If you need a refill on your cardiac medications before your next appointment, please call your pharmacy.   Lab work: None If you have labs (blood work) drawn today and your tests are completely normal, you will receive your results only by: . MyChart Message (if you have MyChart) OR . A paper copy in the mail If you have any lab test that is abnormal or we need to change your treatment, we will call you to review the results.  Testing/Procedures: None  Follow-Up: At [facility], you and your health needs are our priority.  As part of our continuing mission to provide you with exceptional heart care, we have created designated Provider Care Teams.  These Care Teams include your primary Cardiologist (physician) and Advanced Practice Providers (APPs -  Physician Assistants and Nurse Practitioners) who all work together to provide you with the care you need, when you need it. You will need a follow up appointment in 6 months.  Please call our office 2 months in advance to schedule this appointment.  You may see Courteny Egler, MD or one of the following Advanced Practice Providers on your designated Care Team:   Christopher Berge, NP Ryan Dunn, PA-C . Jacquelyn Visser, PA-C   

## 2018-11-28 NOTE — Progress Notes (Signed)
Virtual Visit via Video Note   This visit type was conducted due to national recommendations for restrictions regarding the COVID-19 Pandemic (e.g. social distancing) in an effort to limit this patient's exposure and mitigate transmission in our community.  Due to his co-morbid illnesses, this patient is at least at moderate risk for complications without adequate follow up.  This format is felt to be most appropriate for this patient at this time.  All issues noted in this document were discussed and addressed.  A limited physical exam was performed with this format.  Please refer to the patient's chart for his consent to telehealth for [facility].   Date:  11/28/2018   ID:  Ian Larsen, DOB 1934-12-29, MRN [id number]  Patient Location: Home Provider Location: Office  PCP:  Antony Contras, MD  Cardiologist:  Kathlyn Sacramento, MD  Electrophysiologist:  None   Evaluation Performed:  Follow-Up Visit  Chief Complaint: No complaints today  History of Present Illness:    Ian Larsen is a 84 y.o. male was seen via video visit for follow-up regarding refractory hypertension.    He has known history of essential hypertension, spinal stenosis, prostate cancer and borderline diabetes. He has history of spinal stenosis status post cervical spine surgery in 2006.  He has no history of sleep apnea.  Renal artery duplex in 2019 showed no evidence of renal artery stenosis.  His blood pressure improved after increasing carvedilol and amlodipine and he is felt to have a component of whitecoat syndrome given significant discrepancy in blood pressure readings at home and in the office.   He has been doing very well with no recent chest pain, shortness of breath or palpitations.   He has been exercising regularly by walking daily for at least 30 minutes with no exertional symptoms. He did have a recent upper respiratory tract infection and he was tested for both COVID and the flu and both were  negative.    The patient does not have symptoms concerning for COVID-19 infection (fever, chills, cough, or new shortness of breath).    Past Medical History:  Diagnosis Date  . Fusion of spine of cervical region 2005  . Hypertension    a. 12/2017 Renal artery duplex: No renal artery stenosis. Nl Celiac and SMA artery findings.  . Prostate cancer ([place]) 1999   a. 1999 s/p radical prostatectomy and radical lymph node resections.   . Renal cyst, right    a. 12/2017 Renal Duplex: incidental finding of 2.3 x 2.1 cm avascular cystic area in upper pole and 7.2 x 6.2 x 6.5 cm avascular cyst in upper-posterior portion of right kidney.   Past Surgical History:  Procedure Laterality Date  . CERVICAL FUSION    . HERNIA REPAIR    . PROSTATECTOMY       Current Meds  Medication Sig  . amLODipine (NORVASC) 10 MG tablet Take 1 tablet (10 mg total) by mouth daily.  . carvedilol (COREG) 6.25 MG tablet Take 1 tablet (6.25 mg total) by mouth 2 (two) times daily with a meal.  . Cholecalciferol (VITAMIN D) 2000 units tablet Take 2,000 Units by mouth daily.  . Coenzyme Q10 (CO Q-10) 100 MG CAPS Take by mouth daily.  . Magnesium 100 MG CAPS Take by mouth daily.  . Misc Natural Products (OSTEO BI-FLEX JOINT SHIELD PO) Take by mouth daily.  . Multiple Vitamin (MULTIVITAMIN) capsule Take 1 capsule by mouth daily.  Marland Kitchen OLIVE LEAF PO Take by mouth daily.  . Omega-3  Fatty Acids (FISH OIL PO) Take by mouth daily.  . TURMERIC CURCUMIN PO Take by mouth daily.  . valsartan-hydrochlorothiazide (DIOVAN-HCT) 320-25 MG tablet Take 1 tablet by mouth daily.     Allergies:   Fluviral [flu virus vaccine]; Neomycin; Tape; Vicodin [hydrocodone-acetaminophen]; and Vinegar [acetic acid]   Social History   Tobacco Use  . Smoking status: Former Smoker    Years: 1.00    Types: Cigarettes  . Smokeless tobacco: Never Used  Substance Use Topics  . Alcohol use: Never    Frequency: Never  . Drug use: Never     Family  Hx: The patient's family history includes Heart Problems in his mother; Hypertension in his brother and sister.  ROS:   Please see the history of present illness.     All other systems reviewed and are negative.   Prior CV studies:   The following studies were reviewed today:    Labs/Other Tests and Data Reviewed:    EKG:  No ECG reviewed.  Recent Labs: No results found for requested labs within last 8760 hours.   Recent Lipid Panel No results found for: CHOL, TRIG, HDL, CHOLHDL, LDLCALC, LDLDIRECT  Wt Readings from Last 3 Encounters:  11/28/18 207 lb (93.9 kg)  05/30/18 214 lb (97.1 kg)  02/26/18 208 lb 4 oz (94.5 kg)     Objective:    Vital Signs:  BP 134/79 (BP Location: Right Arm, Patient Position: Sitting, Cuff Size: Normal)   Pulse 70   Ht 5\' 11"  (1.803 m)   Wt 207 lb (93.9 kg)   BMI 28.87 kg/m    VITAL SIGNS:  reviewed GEN:  no acute distress EYES:  sclerae anicteric, EOMI - Extraocular Movements Intact RESPIRATORY:  normal respiratory effort, symmetric expansion SKIN:  no rash, lesions or ulcers. MUSCULOSKELETAL:  no obvious deformities. NEURO:  alert and oriented x 3, no obvious focal deficit PSYCH:  normal affect  ASSESSMENT & PLAN:    1.  Refractory hypertension: No evidence of secondary hypertension.  Home blood pressure readings are excellent.  He has no other symptoms.  Continue same medications.   COVID-19 Education: The signs and symptoms of COVID-19 were discussed with the patient and how to seek care for testing (follow up with PCP or arrange E-visit).  The importance of social distancing was discussed today.  Time:   Today, I have spent 9 minutes with the patient with telehealth technology discussing the above problems.     Medication Adjustments/Labs and Tests Ordered: Current medicines are reviewed at length with the patient today.  Concerns regarding medicines are outlined above.   Tests Ordered: No orders of the defined types  were placed in this encounter.   Medication Changes: No orders of the defined types were placed in this encounter.   Disposition:  Follow up in 6 month(s)  Signed, Kathlyn Sacramento, MD  11/28/2018 8:38 AM    [facility]

## 2019-02-28 ENCOUNTER — Other Ambulatory Visit: Payer: Self-pay | Admitting: Cardiovascular Disease

## 2019-02-28 MED ORDER — CARVEDILOL 6.25 MG PO TABS: 6.2500 mg | ORAL_TABLET | Freq: Two times a day (BID) | ORAL | 3 refills | Status: DC

## 2019-02-28 NOTE — Telephone Encounter (Signed)
°*  STAT* If patient is at the pharmacy, call can be transferred to refill team.   1. Which medications need to be refilled? (please list name of each medication and dose if known) Carvedilol 6.25 mg bid  2. Which pharmacy/location (including street and city if local pharmacy) [facility]  3. Do they need a 30 day or 90 day supply? [place]

## 2019-06-03 ENCOUNTER — Other Ambulatory Visit: Payer: Self-pay

## 2019-06-03 ENCOUNTER — Ambulatory Visit (INDEPENDENT_AMBULATORY_CARE_PROVIDER_SITE_OTHER): Payer: Medicare Other | Admitting: Cardiovascular Disease

## 2019-06-03 ENCOUNTER — Encounter: Payer: Self-pay | Admitting: Cardiovascular Disease

## 2019-06-03 VITALS — BP 150/70 | HR 69 | Ht 71.0 in | Wt 213.5 lb

## 2019-06-03 DIAGNOSIS — I10 Essential (primary) hypertension: Secondary | ICD-10-CM

## 2019-06-03 NOTE — Patient Instructions (Signed)
Medication Instructions:  Your physician recommends that you continue on your current medications as directed. Please refer to the Current Medication list given to you today.  *If you need a refill on your cardiac medications before your next appointment, please call your pharmacy*  Lab Work: None ordered If you have labs (blood work) drawn today and your tests are completely normal, you will receive your results only by: Marland Kitchen MyChart Message (if you have MyChart) OR . A paper copy in the mail If you have any lab test that is abnormal or we need to change your treatment, we will call you to review the results.  Testing/Procedures: None ordered  Follow-Up: At [facility], you and your health needs are our priority.  As part of our continuing mission to provide you with exceptional heart care, we have created designated Provider Care Teams.  These Care Teams include your primary Cardiologist (physician) and Advanced Practice Providers (APPs -  Physician Assistants and Nurse Practitioners) who all work together to provide you with the care you need, when you need it.  Your next appointment:   12 month(s)  The format for your next appointment:   In Person  Provider:    You may see Kathlyn Sacramento, MD or one of the following Advanced Practice Providers on your designated Care Team:    Murray Hodgkins, NP  Christell Faith, PA-C  Marrianne Mood, PA-C   Other Instructions N/A

## 2019-06-03 NOTE — Progress Notes (Signed)
Cardiology Office Note   Date:  06/03/2019   ID:  Ian Larsen, DOB 1935/05/17, MRN [id number]  PCP:  Antony Contras, MD  Cardiologist:   Kathlyn Sacramento, MD   Chief Complaint  Patient presents with  . Other    6 month follow up. patient denies chest pain and SOB at this time. Meds reviewed verbally with patient.       History of Present Illness: Ian Larsen is a 84 y.o. male who is here today for follow-up visit regarding refractory hypertension.    He has known history of essential hypertension, spinal stenosis, prostate cancer and borderline diabetes. He has history of spinal stenosis status post cervical spine surgery in 2006.  He has no history of sleep apnea.  Renal artery duplex in 2019 showed no evidence of renal artery stenosis.  His blood pressure improved after increasing carvedilol and amlodipine and he is felt to have a component of whitecoat syndrome given significant discrepancy in blood pressure readings at home and in the office.   He has been doing very well with no recent chest pain, shortness of breath or palpitations.  He exercises regularly with no exertional symptoms.  Blood pressure is under excellent control at home.    Past Medical History:  Diagnosis Date  . Fusion of spine of cervical region 2005  . Hypertension    a. 12/2017 Renal artery duplex: No renal artery stenosis. Nl Celiac and SMA artery findings.  . Prostate cancer ([place]) 1999   a. 1999 s/p radical prostatectomy and radical lymph node resections.   . Renal cyst, right    a. 12/2017 Renal Duplex: incidental finding of 2.3 x 2.1 cm avascular cystic area in upper pole and 7.2 x 6.2 x 6.5 cm avascular cyst in upper-posterior portion of right kidney.    Past Surgical History:  Procedure Laterality Date  . CERVICAL FUSION    . HERNIA REPAIR    . PROSTATECTOMY       Current Outpatient Medications  Medication Sig Dispense Refill  . carvedilol (COREG) 6.25 MG tablet Take 1 tablet (6.25 mg  total) by mouth 2 (two) times daily with a meal. 180 tablet 3  . Cholecalciferol (VITAMIN D) 2000 units tablet Take 2,000 Units by mouth daily.    . Coenzyme Q10 (CO Q-10) 100 MG CAPS Take by mouth daily.    . Magnesium 100 MG CAPS Take by mouth daily.    . Misc Natural Products (OSTEO BI-FLEX JOINT SHIELD PO) Take by mouth daily.    . Multiple Vitamin (MULTIVITAMIN) capsule Take 1 capsule by mouth daily.    Marland Kitchen OLIVE LEAF PO Take by mouth daily.    . Omega-3 Fatty Acids (FISH OIL PO) Take by mouth daily.    . TURMERIC CURCUMIN PO Take by mouth daily.    . valsartan-hydrochlorothiazide (DIOVAN-HCT) 320-25 MG tablet Take 1 tablet by mouth daily.    Marland Kitchen amLODipine (NORVASC) 10 MG tablet Take 1 tablet (10 mg total) by mouth daily. 90 tablet 3   No current facility-administered medications for this visit.     Allergies:   Fluviral [flu virus vaccine], Neomycin, Tape, Vicodin [hydrocodone-acetaminophen], and Vinegar [acetic acid]    Social History:  The patient  reports that he has quit smoking. His smoking use included cigarettes. He quit after 1.00 year of use. He has never used smokeless tobacco. He reports that he does not drink alcohol or use drugs.   Family History:  The patient's family history includes  Heart Problems in his mother; Hypertension in his brother and sister.    ROS:  Please see the history of present illness.   Otherwise, review of systems are positive for none.   All other systems are reviewed and negative.    PHYSICAL EXAM: VS:  BP (!) 150/70 (BP Location: Left Arm, Patient Position: Sitting, Cuff Size: Normal)   Pulse 69   Ht 5\' 11"  (1.803 m)   Wt 213 lb 8 oz (96.8 kg)   BMI 29.78 kg/m  , BMI Body mass index is 29.78 kg/m. GEN: Well nourished, well developed, in no acute distress  HEENT: normal  Neck: no JVD, carotid bruits, or masses Cardiac: RRR; no murmurs, rubs, or gallops, mild leg edema  Respiratory:  clear to auscultation bilaterally, normal work of  breathing GI: soft, nontender, nondistended, + BS MS: no deformity or atrophy  Skin: warm and dry, no rash Neuro:  Strength and sensation are intact Psych: euthymic mood, full affect   EKG:  EKG is ordered today. The ekg ordered today demonstrates normal sinus rhythm with left anterior fascicular block.  Recent Labs: No results found for requested labs within last 8760 hours.    Lipid Panel No results found for: CHOL, TRIG, HDL, CHOLHDL, VLDL, LDLCALC, LDLDIRECT    Wt Readings from Last 3 Encounters:  06/03/19 213 lb 8 oz (96.8 kg)  11/28/18 207 lb (93.9 kg)  05/30/18 214 lb (97.1 kg)       PAD Screen 01/11/2018  Previous PAD dx? No  Previous surgical procedure? No  Pain with walking? Yes  Subsides with rest? No  Feet/toe relief with dangling? No  Painful, non-healing ulcers? No  Extremities discolored? No      ASSESSMENT AND PLAN:  1.  Refractory hypertension: No evidence of secondary hypertension.  Home blood pressure readings are excellent.  He has no other symptoms.  Continue same medications including amlodipine, carvedilol and valsartan-hydrochlorothiazide. He gets his labs done with his primary care physician.  Disposition:   FU with me in 12 months  Signed,  Kathlyn Sacramento, MD  06/03/2019 1:44 PM    [place]

## 2019-06-17 ENCOUNTER — Other Ambulatory Visit: Payer: Self-pay | Admitting: Cardiovascular Disease

## 2019-06-17 MED ORDER — AMLODIPINE BESYLATE 10 MG PO TABS: 10.0000 mg | ORAL_TABLET | Freq: Every day | ORAL | 3 refills | Status: DC

## 2019-06-17 NOTE — Telephone Encounter (Signed)
*  STAT* If patient is at the pharmacy, call can be transferred to refill team.   1. Which medications need to be refilled? (please list name of each medication and dose if known) Amlodipine 10 mg po q d   2. Which pharmacy/location (including street and city if local pharmacy) [facility]   3. Do they need a 30 day or 90 day supply? [place]

## 2019-06-17 NOTE — Telephone Encounter (Signed)
Requested Prescriptions   Signed Prescriptions Disp Refills  . amLODipine (NORVASC) 10 MG tablet 90 tablet 3    Sig: Take 1 tablet (10 mg total) by mouth daily.    Authorizing Provider: Kathlyn Sacramento A    Ordering User: Raelene Bott, Connelly Spruell L

## 2020-02-19 ENCOUNTER — Other Ambulatory Visit: Payer: Self-pay

## 2020-02-19 MED ORDER — CARVEDILOL 6.25 MG PO TABS: 6.2500 mg | ORAL_TABLET | Freq: Two times a day (BID) | ORAL | 0 refills | Status: DC

## 2020-02-19 NOTE — Telephone Encounter (Signed)
*  STAT* If patient is at the pharmacy, call can be transferred to refill team.   1. Which medications need to be refilled? (please list name of each medication and dose if known) Carvedilol  2. Which pharmacy/location (including street and city if local pharmacy) is medication to be sent to? [facility]  3. Do they need a 30 day or 90 day supply? [place]

## 2020-04-26 ENCOUNTER — Ambulatory Visit: Payer: Medicare Other | Admitting: Dermatology

## 2020-04-26 ENCOUNTER — Other Ambulatory Visit: Payer: Self-pay

## 2020-04-26 DIAGNOSIS — L814 Other melanin hyperpigmentation: Secondary | ICD-10-CM

## 2020-04-26 DIAGNOSIS — D485 Neoplasm of uncertain behavior of skin: Secondary | ICD-10-CM

## 2020-04-26 DIAGNOSIS — L720 Epidermal cyst: Secondary | ICD-10-CM | POA: Diagnosis not present

## 2020-04-26 DIAGNOSIS — L820 Inflamed seborrheic keratosis: Secondary | ICD-10-CM

## 2020-04-26 DIAGNOSIS — Z1283 Encounter for screening for malignant neoplasm of skin: Secondary | ICD-10-CM | POA: Diagnosis not present

## 2020-04-26 DIAGNOSIS — D1800 Hemangioma unspecified site: Secondary | ICD-10-CM

## 2020-04-26 DIAGNOSIS — L578 Other skin changes due to chronic exposure to nonionizing radiation: Secondary | ICD-10-CM

## 2020-04-26 DIAGNOSIS — L821 Other seborrheic keratosis: Secondary | ICD-10-CM | POA: Diagnosis not present

## 2020-04-26 DIAGNOSIS — D229 Melanocytic nevi, unspecified: Secondary | ICD-10-CM

## 2020-04-26 MED ORDER — MUPIROCIN 2 % EX OINT: TOPICAL_OINTMENT | CUTANEOUS | 0 refills | Status: DC

## 2020-04-26 MED ORDER — DOXYCYCLINE HYCLATE 100 MG PO CAPS: 100.0000 mg | ORAL_CAPSULE | Freq: Two times a day (BID) | ORAL | 0 refills | Status: AC

## 2020-04-26 NOTE — Patient Instructions (Addendum)
Wound Care Instructions  1. Cleanse wound gently with soap and water once a day then pat dry with clean gauze. Apply a thing coat of Mupirocin (Bactroban) over the wound (unless you have an allergy to this). We recommend that you use a new, sterile tube of Mupirocin. Do not pick or remove scabs. Do not remove the yellow or white "healing tissue" from the base of the wound.  2. Cover the wound with fresh, clean, nonstick gauze and secure with paper tape. You may use Band-Aids in place of gauze and tape if the would is small enough, but would recommend trimming much of the tape off as there is often too much. Sometimes Band-Aids can irritate the skin.  3. You should call the office for your biopsy report after 1 week if you have not already been contacted.  4. If you experience any problems, such as abnormal amounts of bleeding, swelling, significant bruising, significant pain, or evidence of infection, please call the office immediately.  5. FOR ADULT SURGERY PATIENTS: If you need something for pain relief you may take 1 extra strength Tylenol (acetaminophen) AND 2 Ibuprofen (200mg  each) together every 4 hours as needed for pain. (do not take these if you are allergic to them or if you have a reason you should not take them.) Typically, you may only need pain medication for 1 to 3 days.      Epidermal Cyst Removal  Epidermal cyst removal is a procedure to remove a sac of oily material (keratin) that has formed under your skin (epidermal cyst). Epidermal cysts may also be called epidermoid cysts, sebaceous cysts, or keratin cysts. Normally, the skin secretes this oily material through a gland or a hair follicle. However, when a skin gland or hair follicle becomes blocked, an epidermal cyst can form. You may need this procedure if you have an epidermal cyst that becomes large, uncomfortable, or infected. Tell a health care provider about:  Any allergies you have.  All medicines you are taking,  including vitamins, herbs, eye drops, creams, and over-the-counter medicines.  Any problems you or family members have had with anesthetic medicines.  Any blood disorders you have.  Any surgeries you have had.  Any medical conditions you have now or have had.  Whether you are pregnant or may be pregnant. What are the risks? Generally, this is a safe procedure. However, problems may occur, including:  Development of another cyst.  Bleeding.  Infection.  Scarring. What happens before the procedure?  Ask your health care provider about: ? Changing or stopping your regular medicines. This is especially important if you are taking diabetes medicines or blood thinners. ? Taking medicines such as aspirin and ibuprofen. These medicines can thin your blood. Do not take these medicines unless your health care provider tells you to take them. ? Taking over-the-counter medicines, vitamins, herbs, and supplements.  If you have an infected cyst, you may have to take antibiotic medicine before the cyst removal. Take your antibiotic as told by your health care provider. Do not stop taking the antibiotic even if you start to feel better.  Take a shower on the morning of your procedure. Your health care provider may ask you to use a germ-killing soap. What happens during the procedure?   You will be given a medicine to numb the area (local anesthetic).  The skin around the cyst will be cleaned with a germ-killing solution.  The health care provider will make a small incision in your skin  over the cyst.  The health care provider will separate the cyst from the surrounding tissues that are under your skin.  If possible, the cyst will be removed undamaged (intact).  If the cyst bursts (ruptures), it will be removed in pieces.  After the cyst is removed, the health care provider will control any bleeding and close the incision with small stitches (sutures). Small incisions may not need  sutures, and the bleeding will be controlled by applying direct pressure with gauze.  The health care provider may apply antibiotic ointment and a bandage (dressing) over the incision. The procedure may vary among health care providers and hospitals. What happens after the procedure?  If your cyst ruptured during the procedure, you may need an antibiotic. If you are prescribed an antibiotic medicine or ointment, take or apply it as told by your health care provider. Do not stop using the antibiotic even if you start to feel better. Summary  Epidermal cyst removal is a procedure to remove a sac of oily material (keratin) that has formed under your skin (epidermal cyst).  You may need this procedure if you have an epidermal cyst that becomes large, uncomfortable, or infected.  The health care provider will make a small incision in your skin to remove the cyst.  If you are prescribed an antibiotic medicine before the procedure, after the procedure, or both, use the antibiotic as told by your health care provider. Do not stop using the antibiotic even if you start to feel better. This information is not intended to replace advice given to you by your health care provider. Make sure you discuss any questions you have with your health care provider. Document Revised: 10/03/2018 Document Reviewed: 04/05/2017 Elsevier Patient Education  Ian Larsen.

## 2020-04-26 NOTE — Progress Notes (Signed)
New Patient Visit  Subjective  Ian Larsen is a 85 y.o. male who presents for the following: TBSE (total body exam. No hx of skin cancer or dysplastic nevi. ) and Spot Check (Cyst on back, getting bigger, recently burst). The patient presents for Total-Body Skin Exam (TBSE) for skin cancer screening and mole check.  Objective  Well appearing patient in no apparent distress; mood and affect are within normal limits.  A full examination was performed including scalp, head, eyes, ears, nose, lips, neck, chest, axillae, abdomen, back, buttocks, bilateral upper extremities, bilateral lower extremities, hands, feet, fingers, toes, fingernails, and toenails. All findings within normal limits unless otherwise noted below.  Objective  Mid Back: 4 x 2.5 cm ruptured cyst with abscess formation   Objective  face x 20 (20): Erythematous keratotic or waxy stuck-on papule or plaque.   Objective  Left Ear: 0.5 cm irritated brown papule  Assessment & Plan  Ruptured epidermal cyst - inflamed with Abscess formation - symptomatic Mid Back I&D performed today. Start Doxycycline 100 mg BID. Take with food.   Start muporocin 2% ointment. Apply to affected area daily.   Incision and Drainage - Mid Back Location: mid back  Informed Consent: Discussed risks (permanent scarring, light or dark discoloration, infection, pain, bleeding, bruising, redness, damage to adjacent structures, and recurrence of the lesion) and benefits of the procedure, as well as the alternatives.  Informed consent was obtained.  Preparation: The area was prepped with alcohol.  Anesthesia: Lidocaine 1% with epinephrine  Procedure Details: An incision was made overlying the lesion. The lesion drained pus and white, chalky cyst material.  A large amount of fluid was drained.   The lesion was multiloculated.   Multiple cavities were opened and drained.   Pieces of cyst wall were extracted.    Antibiotic ointment and a  sterile pressure dressing were applied. The patient tolerated procedure well.  Total number of lesions drained: 1  Plan: The patient was instructed on post-op care. Recommend OTC analgesia as needed for pain.   doxycycline (VIBRAMYCIN) 100 MG capsule - Mid Back  mupirocin ointment (BACTROBAN) 2 % - Mid Back  Inflamed seborrheic keratosis (20) face x 20  Destruction of lesion - face x 20 Complexity: simple   Destruction method: cryotherapy   Informed consent: discussed and consent obtained   Timeout:  patient name, date of birth, surgical site, and procedure verified Lesion destroyed using liquid nitrogen: Yes   Region frozen until ice ball extended beyond lesion: Yes   Outcome: patient tolerated procedure well with no complications   Post-procedure details: wound care instructions given    Neoplasm of uncertain behavior of skin Left Ear  Skin / nail biopsy Type of biopsy: tangential   Informed consent: discussed and consent obtained   Timeout: patient name, date of birth, surgical site, and procedure verified   Procedure prep:  Patient was prepped and draped in usual sterile fashion Prep type:  Isopropyl alcohol Anesthesia: the lesion was anesthetized in a standard fashion   Anesthetic:  1% lidocaine w/ epinephrine 1-100,000 buffered w/ 8.4% NaHCO3 Instrument used: flexible razor blade   Hemostasis achieved with: pressure, aluminum chloride and electrodesiccation   Outcome: patient tolerated procedure well   Post-procedure details: sterile dressing applied and wound care instructions given   Dressing type: bandage and petrolatum    Specimen 1 - Surgical pathology Differential Diagnosis: R/O dysplasia Check Margins: No 0.5 cm irritated brown papule  Skin cancer screening   Lentigines -  Scattered tan macules - Discussed due to sun exposure - Benign, observe - Call for any changes  Seborrheic Keratoses - Stuck-on, waxy, tan-brown papules and plaques  - Discussed  benign etiology and prognosis. - Observe - Call for any changes  Melanocytic Nevi - Tan-brown and/or pink-flesh-colored symmetric macules and papules - Benign appearing on exam today - Observation - Call clinic for new or changing moles - Recommend daily use of broad spectrum spf 30+ sunscreen to sun-exposed areas.   Hemangiomas - Red papules - Discussed benign nature - Observe - Call for any changes  Actinic Damage - Chronic - diffuse scaly erythematous macules with underlying dyspigmentation - Recommend daily broad spectrum sunscreen SPF 30+ to sun-exposed areas, reapply every 2 hours as needed.  - Call for new or changing lesions.  Skin cancer screening performed today.  Return in about 3 months (around 07/27/2020) for 2-3 month recheck cyst.   I, Harriett Sine, CMA, am acting as scribe for Sarina Ser, MD.  Documentation: I have reviewed the above documentation for accuracy and completeness, and I agree with the above.  Sarina Ser, MD

## 2020-04-27 ENCOUNTER — Encounter: Payer: Self-pay | Admitting: Dermatology

## 2020-05-03 ENCOUNTER — Telehealth: Payer: Self-pay

## 2020-05-03 NOTE — Telephone Encounter (Signed)
-----   Message from Ralene Bathe, MD sent at 04/30/2020  7:38 PM EDT ----- Diagnosis Skin , left ear SEBORRHEIC KERATOSIS, POLYPOID, IRRITATED  Benign irritated keratosis No further treatment needed.

## 2020-05-03 NOTE — Telephone Encounter (Signed)
Patient advised of biopsy results.

## 2020-05-28 ENCOUNTER — Other Ambulatory Visit: Payer: Self-pay | Admitting: Cardiovascular Disease

## 2020-05-28 MED ORDER — CARVEDILOL 6.25 MG PO TABS: 6.2500 mg | ORAL_TABLET | Freq: Two times a day (BID) | ORAL | 0 refills | Status: DC

## 2020-05-28 NOTE — Telephone Encounter (Signed)
°*  STAT* If patient is at the pharmacy, call can be transferred to refill team.   1. Which medications need to be refilled? (please list name of each medication and dose if known)   Carvedilol 6.25 po BID   2. Which pharmacy/location (including street and city if local pharmacy) is medication to be sent to[facility]    3. Do they need a 30 day or 90 day supply? [place]

## 2020-05-28 NOTE — Telephone Encounter (Signed)
Rx request sent to pharmacy.  

## 2020-06-09 ENCOUNTER — Other Ambulatory Visit: Payer: Self-pay

## 2020-06-09 ENCOUNTER — Ambulatory Visit: Payer: Medicare Other | Admitting: Nurse Practitioner

## 2020-06-09 ENCOUNTER — Telehealth: Payer: Self-pay | Admitting: Nurse Practitioner

## 2020-06-09 ENCOUNTER — Encounter: Payer: Self-pay | Admitting: Nurse Practitioner

## 2020-06-09 VITALS — BP 126/76 | HR 91 | Ht 71.0 in | Wt 218.5 lb

## 2020-06-09 DIAGNOSIS — I10 Essential (primary) hypertension: Secondary | ICD-10-CM | POA: Diagnosis not present

## 2020-06-09 DIAGNOSIS — I4891 Unspecified atrial fibrillation: Secondary | ICD-10-CM

## 2020-06-09 DIAGNOSIS — R0789 Other chest pain: Secondary | ICD-10-CM

## 2020-06-09 MED ORDER — APIXABAN 5 MG PO TABS: 5.0000 mg | ORAL_TABLET | Freq: Two times a day (BID) | ORAL | 0 refills | Status: DC

## 2020-06-09 MED ORDER — APIXABAN 5 MG PO TABS: 5.0000 mg | ORAL_TABLET | Freq: Two times a day (BID) | ORAL | 11 refills | Status: DC

## 2020-06-09 MED ORDER — CARVEDILOL 6.25 MG PO TABS: 9.3750 mg | ORAL_TABLET | Freq: Two times a day (BID) | ORAL | 3 refills | Status: DC

## 2020-06-09 NOTE — Progress Notes (Signed)
Office Visit    Patient Name: Ian Larsen Date of Encounter: 06/09/2020  Primary Care Provider:  Antony Contras, MD Primary Cardiologist:  Kathlyn Sacramento, MD  Chief Complaint    85 y/o ? with history of hypertension, spinal stenosis, prostate cancer, and borderline diabetes, who presents for follow-up related to hypertension and is found to be in new onset atrial fibrillation.  Past Medical History    Past Medical History:  Diagnosis Date  . Fusion of spine of cervical region 2005  . Hypertension    a. 12/2017 Renal artery duplex: No renal artery stenosis. Nl Celiac and SMA artery findings.  . Polymyalgia rheumatica ([place])   . Prostate cancer ([place]) 1999   a. 1999 s/p radical prostatectomy and radical lymph node resections.   . Renal cyst, right    a. 12/2017 Renal Duplex: incidental finding of 2.3 x 2.1 cm avascular cystic area in upper pole and 7.2 x 6.2 x 6.5 cm avascular cyst in upper-posterior portion of right kidney.   Past Surgical History:  Procedure Laterality Date  . CERVICAL FUSION    . HERNIA REPAIR    . PROSTATECTOMY      Allergies  Allergies  Allergen Reactions  . Amlodipine Other (See Comments)    fatigue  . Amlodipine Besylate     Other reaction(s): fatigue  . Fesoterodine Other (See Comments)  . Fesoterodine Fumarate Er Other (See Comments)  . Fluviral [Flu Virus Vaccine]   . Gabapentin Itching    Other reaction(s): itching  . Hydrocodone-Acetaminophen     Other reaction(s): dizzy  . Influenza Vaccines     Other reaction(s): hyperthermia to 105 and rash  . Mirabegron     Other reaction(s): Unknown  . Neomycin Other (See Comments)    Other reaction(s): Other (See Comments), rash rash   . Tape Dermatitis  . Tramadol Nausea Only    Other reaction(s): Dizziness  . Tramadol Hcl     Other reaction(s): dizzy, nausea  . Vicodin [Hydrocodone-Acetaminophen] Nausea And Vomiting    Patient states this made him dizzy and felt horrible.  . Zoster  Vaccine Live Other (See Comments)    Patient does not wish to take because of his neomycin allergy Other reaction(s): patient does not wish to take because of his neomycin allergy  . Citric Acid Rash  . Other Other (See Comments) and Rash    Hyperthermia to 105 and rash Ascetic acid from products like vinegar  . Sulfa Antibiotics Rash and Other (See Comments)  . Vinegar [Acetic Acid] Rash    History of Present Illness    85 year old male with the above past medical history including hypertension, spinal stenosis, prostate cancer, and borderline diabetes.  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  He was diagnosed with hypertension 1995 and after moving to [place], noted higher blood pressures and establish care with Dr. Fletcher Anon in July 2019.  Renal arterial duplex was performed in July 2019 and showed no evidence of renal artery stenosis.  Blood pressures improved after increasing carvedilol and amlodipine.  It is felt that he has a component of whitecoat syndrome given significant discrepancy in blood pressure readings at home and in office.  He was last seen in cardiology clinic in December 2020 at which time he reported excellent blood pressure control on home readings.  He has continued to note good blood pressure control at home over the past year.  Earlier this year, following his second Covid vaccine,  he noted worsening of polymyalgia/arthralgia to the point where he had difficulty standing due to significant leg pain and weakness.  During the worst of his symptoms, he occasionally noted substernal chest discomfort while sitting at rest, lasting a few minutes, and resolving spontaneously.  He recently saw rheumatology on December 2 and was placed on prednisone and Cymbalta (CRP was elevated at 53).  He says that within 3 to 4 days of prednisone, he noted significant improvement in all symptoms.  He is now able to exercise again and has gotten back to  performing tai chi.  From a cardiac standpoint, he has not had any significant dyspnea on exertion or recurrence of the chest discomfort that he experienced earlier in the year, despite increased activity recently.  He denies palpitations, PND, orthopnea, dizziness, syncope, or early satiety.  He has noticed a slight increase in lower extremity swelling since starting prednisone.  Today, his ECG shows Afib in the 90's.  He denies symptoms.  Upon review of his home heart rates, he typically runs in the 70s or 80s and today is the first day that he has anything in the 90s.  Home Medications    Prior to Admission medications   Medication Sig Start Date End Date Taking? Authorizing Provider  amLODipine (NORVASC) 10 MG tablet Take 1 tablet (10 mg total) by mouth daily. 06/17/19 09/15/19  Wellington Hampshire, MD  carvedilol (COREG) 6.25 MG tablet Take 1 tablet (6.25 mg total) by mouth 2 (two) times daily with a meal. 05/28/20   Wellington Hampshire, MD  Cholecalciferol (VITAMIN D) 2000 units tablet Take 2,000 Units by mouth daily.    [provider]  Coenzyme Q10 (CO Q-10) 100 MG CAPS Take by mouth daily.    [provider]  Magnesium 100 MG CAPS Take by mouth daily.    [provider]  Misc Natural Products (OSTEO BI-FLEX JOINT SHIELD PO) Take by mouth daily.    [provider]  Multiple Vitamin (MULTIVITAMIN) capsule Take 1 capsule by mouth daily.    [provider]  mupirocin ointment (BACTROBAN) 2 % Apply to affected area daily 04/26/20   Ralene Bathe, MD  OLIVE LEAF PO Take by mouth daily.    [provider]  Omega-3 Fatty Acids (FISH OIL PO) Take by mouth daily.    [provider]  TURMERIC CURCUMIN PO Take by mouth daily.    [provider]  valsartan-hydrochlorothiazide (DIOVAN-HCT) 320-25 MG tablet Take 1 tablet by mouth daily.    [provider]    Review of Systems    Has been dealing with polyarthralgia which  has improved significantly since starting prednisone.  Several months ago, he noted occasional mild substernal discomfort occurring at rest but he has not had any symptoms recently, despite resuming regular activities.  He denies dyspnea, palpitations, PND, orthopnea, dizziness, syncope, or early satiety.  He sometimes notes lower extremity swelling around his ankles.  All other systems reviewed and are otherwise negative except as noted above.  Physical Exam    VS:  BP 126/76 (BP Location: Left Arm, Patient Position: Sitting, Cuff Size: Normal)   Pulse 91   Ht 5' 11"  (1.803 m)   Wt 218 lb 8 oz (99.1 kg)   SpO2 98%   BMI 30.47 kg/m  , BMI Body mass index is 30.47 kg/m. GEN: Well nourished, well developed, in no acute distress. HEENT: normal. Neck: Supple, no JVD, carotid bruits, or masses. Cardiac: Irregularly irregular, no  murmurs, rubs, or gallops. No clubbing, cyanosis, 1+ bilateral lower extremity edema to the lower calf.  Radials/PT 2+ and equal bilaterally.  Respiratory:  Respirations regular and unlabored, clear to auscultation bilaterally. GI: Soft, nontender, nondistended, BS + x 4. MS: no deformity or atrophy. Skin: warm and dry, no rash. Neuro:  Strength and sensation are intact. Psych: Normal affect.  Accessory Clinical Findings    ECG personally reviewed by me today -atrial fibrillation, 97, left axis deviation, left anterior fascicular block- no acute changes.  Assessment & Plan    1.  Atrial fibrillation: Duration unknown though home heart rates typically trend in the 70s to 80 and rates are 90s in the office today.  He is completely asymptomatic.  CHA2DS2VASc equals at least 3.  We discussed management options at length today.  Given mild elevation of heart rates, I will increase his carvedilol to 9.375 mg twice daily (he was reluctant to increase to 12.5 twice daily).  I will also add Eliquis 5 mg twice daily.  CBC, basic metabolic panel, magnesium, and TSH today.  I  will arrange for 2D echocardiogram to assess for structural abnormalities.  Pending echo may need to consider stress testing.  Plan to follow-up in the office in 3 to 4 weeks, at which time we can consider cardioversion if he remains in atrial fibrillation.  2.  Essential hypertension: Stable on beta-blocker, ACE inhibitor, calcium channel blocker, and diuretic therapy.    3.  Chest tightness: Patient noted that periods over the summer, he was experiencing mild intermittent chest tightness that occurred at rest and resolved spontaneously.  He thought it was associated with his polymyalgias.  It has not occurred in several months despite resumption of usual activities.  He has not had any dyspnea.  As above, follow-up echocardiogram today.  We discussed that pending result, we might consider stress testing.  4.  Polyarthralgia/polymyalgia: Seen by rheumatology.  CRP was elevated at 53 and he has been on prednisone with significant improvement in symptoms.  He does have mild ankle swelling since being on prednisone and we discussed wearing compression socks.  5.  Disposition: Follow-up CBC, basic metabolic panel, magnesium, TSH, and echo.  Follow-up in clinic in 3 to 4 weeks or sooner if necessary.   Murray Hodgkins, NP 06/09/2020, 9:56 AM

## 2020-06-09 NOTE — Patient Instructions (Addendum)
Medication Instructions:  Your physician has recommended you make the following change in your medication:  1. INCREASE Carvedilol (Coreg) 6.25 mg to 1.5 tablet (9.375 mg) twice a day  2. START Eliquis 5 mg twice daily  *If you need a refill on your cardiac medications before your next appointment, please call your pharmacy*   Lab Work: CBC, BMET, Mag, TSH  If you have labs (blood work) drawn today and your tests are completely normal, you will receive your results only by: Marland Kitchen MyChart Message (if you have MyChart) OR . A paper copy in the mail If you have any lab test that is abnormal or we need to change your treatment, we will call you to review the results.   Testing/Procedures: Your physician has requested that you have an echocardiogram. Echocardiography is a painless test that uses sound waves to create images of your heart. It provides your doctor with information about the size and shape of your heart and how well your heart's chambers and valves are working. This procedure takes approximately one hour. There are no restrictions for this procedure.     Follow-Up: At [facility], you and your health needs are our priority.  As part of our continuing mission to provide you with exceptional heart care, we have created designated Provider Care Teams.  These Care Teams include your primary Cardiologist (physician) and Advanced Practice Providers (APPs -  Physician Assistants and Nurse Practitioners) who all work together to provide you with the care you need, when you need it.  We recommend signing up for the patient portal called "MyChart".  Sign up information is provided on this After Visit Summary.  MyChart is used to connect with patients for Virtual Visits (Telemedicine).  Patients are able to view lab/test results, encounter notes, upcoming appointments, etc.  Non-urgent messages can be sent to your provider as well.   To learn more about what you can do with MyChart, go to  [email or web address].    Your next appointment:   1 month(s)  The format for your next appointment:   In Person  Provider:   Kathlyn Sacramento, MD or Murray Hodgkins, NP

## 2020-06-09 NOTE — Telephone Encounter (Signed)
Left voicemail message to call back regarding Eliquis and mail order service.

## 2020-06-09 NOTE — Telephone Encounter (Signed)
Left voicemail message to call back regarding Eliquis.   Called mail order service after sending in 30 day script. They state that it has been processed at the moment. I did inquire about the cost of 90 day supply. They could not give specific cost but that it would be around mid $60's for that 90 day supply. Just wanted to review this information with the patient.

## 2020-06-10 ENCOUNTER — Ambulatory Visit (INDEPENDENT_AMBULATORY_CARE_PROVIDER_SITE_OTHER): Payer: Medicare Other

## 2020-06-10 ENCOUNTER — Telehealth: Payer: Self-pay | Admitting: *Deleted

## 2020-06-10 DIAGNOSIS — I4891 Unspecified atrial fibrillation: Secondary | ICD-10-CM | POA: Diagnosis not present

## 2020-06-10 LAB — CBC
Hematocrit: 45.5 % (ref 37.5–51.0)
Hemoglobin: 15.4 g/dL (ref 13.0–17.7)
MCH: 30.4 pg (ref 26.6–33.0)
MCHC: 33.8 g/dL (ref 31.5–35.7)
MCV: 90 fL (ref 79–97)
Platelets: 268 10*3/uL (ref 150–450)
RBC: 5.06 x10E6/uL (ref 4.14–5.80)
RDW: 12 % (ref 11.6–15.4)
WBC: 9.7 10*3/uL (ref 3.4–10.8)

## 2020-06-10 LAB — BASIC METABOLIC PANEL
BUN/Creatinine Ratio: 19 (ref 10–24)
BUN: 20 mg/dL (ref 8–27)
CO2: 26 mmol/L (ref 20–29)
Calcium: 10.2 mg/dL (ref 8.6–10.2)
Chloride: 98 mmol/L (ref 96–106)
Creatinine, Ser: 1.08 mg/dL (ref 0.76–1.27)
GFR calc Af Amer: 73 mL/min/{1.73_m2} (ref >59)
GFR calc non Af Amer: 63 mL/min/{1.73_m2} (ref >59)
Glucose: 95 mg/dL (ref 65–99)
Potassium: 4.2 mmol/L (ref 3.5–5.2)
Sodium: 139 mmol/L (ref 134–144)

## 2020-06-10 LAB — ECHOCARDIOGRAM COMPLETE
AR max vel: 3.21 cm2
AV Area VTI: 2.92 cm2
AV Area mean vel: 2.98 cm2
AV Mean grad: 2 mmHg
AV Peak grad: 4.2 mmHg
Ao pk vel: 1.03 m/s
Single Plane A4C EF: 47.7 %

## 2020-06-10 LAB — MAGNESIUM: Magnesium: 2.4 mg/dL — ABNORMAL HIGH (ref 1.6–2.3)

## 2020-06-10 LAB — TSH: TSH: 1.47 u[IU]/mL (ref 0.450–4.500)

## 2020-06-10 NOTE — Telephone Encounter (Signed)
Pt calling today to follow up on Eliquis Rx. He wanted to notify nurse he was speaking with Jeannene Patella) that he activated the 30 day card and [facility] did honor this and he received 30 day supply. He said Pam was helping him determine which pharmacy he could receive Eliquis at lowest cost. Pharmacist at [facility] told him that a 30 day supply there would be $24. Pt said since this sounds comparable to having 90 day at mail order ($60's) he will [facility]. [facility] and pt asks to wait and would like to speak with Pam before sending. Will forward message.

## 2020-06-10 NOTE — Telephone Encounter (Signed)
-----   Message from Theora Gianotti, NP sent at 06/10/2020  7:50 AM EST ----- Blood counts, electrolytes, kidney function, TSH wnl.  Mg mildly elev, which is fine in this setting.  No reversible causes for afib noted.

## 2020-06-10 NOTE — Telephone Encounter (Signed)
Attempted to call pt to review lab results. No answer. LMOM TCB. Pt is scheduled for Echo today in our office at 1:00 PM.

## 2020-06-10 NOTE — Telephone Encounter (Signed)
The patient has been notified of the result and verbalized understanding.  All questions (if any) were answered.     

## 2020-06-11 ENCOUNTER — Telehealth: Payer: Self-pay | Admitting: *Deleted

## 2020-06-11 MED ORDER — APIXABAN 5 MG PO TABS: 5.0000 mg | ORAL_TABLET | Freq: Two times a day (BID) | ORAL | 3 refills | Status: DC

## 2020-06-11 NOTE — Telephone Encounter (Signed)
Left voicemail message to call back for results.  

## 2020-06-11 NOTE — Telephone Encounter (Signed)
Spoke with patient and reviewed echo results and he had no further questions at this time.

## 2020-06-11 NOTE — Telephone Encounter (Signed)
Spoke with patient and reviewed that script sent to mail order was 30 day supply but submitting 90 day supply to them today so that moving forward he should get the 90 days for around $60's. Advised that I did try to call his insurance several times to try and switch but never reached anyone to get that updated before being processed. He verbalized understanding, was appreciative for the call and had no further questions at this time.

## 2020-06-11 NOTE — Telephone Encounter (Signed)
-----   Message from Theora Gianotti, NP sent at 06/11/2020  7:43 AM EST ----- Normal heart squeezing function.  The tricuspid valve is mildly to moderately leaky.  The mitral valve is mildly leaky.  Neither are likely to be contributing to afib or are of clinical significance at this time.

## 2020-06-21 NOTE — Telephone Encounter (Signed)
Spoke to pt. Pt states he understands echo results. He is calling because he received a call from insurance stating that his echo was approved but then later was told a second test was denied and pt is confused as to what test they are referring to. I notified pt I will find out how to help him regarding this and he is appreciative and states ok to call him tomorrow.

## 2020-06-21 NOTE — Telephone Encounter (Signed)
Patient calling back requesting to speak with [facility] regarding his echo  Please advise

## 2020-06-22 NOTE — Telephone Encounter (Signed)
Pt reports that he did not receive a bill and is unsure exactly who notified him that a "second test" was denied by insurance. Verified with pt that there has not been another procedure scheduled since echo. Pt has been given billing dept contact information in the case he does receive a bill. Pt states there is not a billing issue at this time, only wanted to clarify if a second test was ordered. Pt appreciative of help, and will notify our office if any further issue. No other needs at this time.

## 2020-06-28 ENCOUNTER — Telehealth: Payer: Self-pay | Admitting: Cardiovascular Disease

## 2020-06-28 MED ORDER — AMLODIPINE BESYLATE 10 MG PO TABS: 10.0000 mg | ORAL_TABLET | Freq: Every day | ORAL | 0 refills | Status: DC

## 2020-06-28 NOTE — Telephone Encounter (Signed)
Requested Prescriptions   Signed Prescriptions Disp Refills   amLODipine (NORVASC) 10 MG tablet 90 tablet 0    Sig: Take 1 tablet (10 mg total) by mouth daily.    Authorizing Provider: Lorine Bears A    Ordering User: Thayer Headings, Seanne Chirico L

## 2020-06-28 NOTE — Telephone Encounter (Signed)
*  STAT* If patient is at the pharmacy, call can be transferred to refill team.   1. Which medications need to be refilled? (please list name of each medication and dose if known) amlodipine 10 MG daily  2. Which pharmacy/location (including street and city if local pharmacy) is medication to be sent to? [facility]  3. Do they need a 30 day or 90 day supply? 90 day

## 2020-07-02 NOTE — Telephone Encounter (Signed)
Patient calling  States that he received a call from [facility]  They are refusing to fill amlodipine medication until they hear from our office on how it will interact with his other medications, state they have been trying to get in touch with [place] for days so they advised patient to call  Phone number : [phone] Patient states if they still refuse to fill prescription he may want a 30 day supply sent to [facility] on [address] Please review and call to discuss

## 2020-07-05 NOTE — Telephone Encounter (Addendum)
Contacted [facility] provider line. The telephone number listed below is not valid for [facility].  Spoke with Nunzio Cory at [facility]. There is no hold on the patients Amlodipine and they are preparing to ship. When reviewing the records the was a hold back in May 2021, but that was removed several months ago.  Called the pt and lmom. [facility] is preparing to ship his Amlodipine prescription. Pt is to call back if he will need a refill sent to a local pharmacy in the mean time.

## 2020-07-15 ENCOUNTER — Ambulatory Visit: Payer: Medicare Other | Admitting: Physician Assistant

## 2020-07-15 ENCOUNTER — Encounter: Payer: Self-pay | Admitting: Physician Assistant

## 2020-07-15 ENCOUNTER — Other Ambulatory Visit: Payer: Self-pay

## 2020-07-15 VITALS — BP 140/82 | HR 106 | Ht 71.0 in | Wt 212.0 lb

## 2020-07-15 DIAGNOSIS — M2550 Pain in unspecified joint: Secondary | ICD-10-CM

## 2020-07-15 DIAGNOSIS — I10 Essential (primary) hypertension: Secondary | ICD-10-CM | POA: Diagnosis not present

## 2020-07-15 DIAGNOSIS — I4891 Unspecified atrial fibrillation: Secondary | ICD-10-CM | POA: Diagnosis not present

## 2020-07-15 MED ORDER — CARVEDILOL 12.5 MG PO TABS: 12.5000 mg | ORAL_TABLET | Freq: Two times a day (BID) | ORAL | 1 refills | Status: DC

## 2020-07-15 NOTE — Progress Notes (Addendum)
Office Visit    Patient Name: Ian Larsen Date of Encounter: 07/15/2020  Primary Care Provider:  Antony Contras, MD Primary Cardiologist:  Kathlyn Sacramento, MD  Chief Complaint    Chief Complaint  Patient presents with   Follow-up    1 month    Pt wondering if we can refill and follow Valsartan--states he has plenty for right now, but previously followed by PCP. Pt would like for [place] to follow this instead. Pt states no new cardiac Sx.     86 year old male with history of hypertension, spinal stenosis, prostate cancer, borderline diabetes, and who presents for follow-up after found to be in new onset atrial fibrillation 06/09/2020.  Past Medical History    Past Medical History:  Diagnosis Date   Fusion of spine of cervical region 2005   Hypertension    a. 12/2017 Renal artery duplex: No renal artery stenosis. Nl Celiac and SMA artery findings.   Polymyalgia rheumatica (HCC)    Prostate cancer ([place]) 1999   a. 1999 s/p radical prostatectomy and radical lymph node resections.    Renal cyst, right    a. 12/2017 Renal Duplex: incidental finding of 2.3 x 2.1 cm avascular cystic area in upper pole and 7.2 x 6.2 x 6.5 cm avascular cyst in upper-posterior portion of right kidney.   Past Surgical History:  Procedure Laterality Date   CERVICAL FUSION     HERNIA REPAIR     PROSTATECTOMY      Allergies  Allergies  Allergen Reactions   Amlodipine Other (See Comments)    fatigue   Amlodipine Besylate     Other reaction(s): fatigue   Fesoterodine Other (See Comments)   Fesoterodine Fumarate Er Other (See Comments)   Fluviral [Influenza Virus Vaccine]    Gabapentin Itching    Other reaction(s): itching   Hydrocodone-Acetaminophen     Other reaction(s): dizzy   Influenza Vaccines     Other reaction(s): hyperthermia to 105 and rash   Mirabegron     Other reaction(s): Unknown   Neomycin Other (See Comments)    Other reaction(s): Other (See Comments),  rash rash    Tape Dermatitis   Tramadol Nausea Only    Other reaction(s): Dizziness   Tramadol Hcl     Other reaction(s): dizzy, nausea   Vicodin [Hydrocodone-Acetaminophen] Nausea And Vomiting    Patient states this made him dizzy and felt horrible.   Zoster Vaccine Live Other (See Comments)    Patient does not wish to take because of his neomycin allergy Other reaction(s): patient does not wish to take because of his neomycin allergy   Citric Acid Rash   Other Other (See Comments) and Rash    Hyperthermia to 105 and rash Ascetic acid from products like vinegar   Sulfa Antibiotics Rash and Other (See Comments)   Vinegar [Acetic Acid] Rash    History of Present Illness    Ian Larsen is a 86 y.o. male with PMH as above.  He is retired Astronomer, originally from [place].  He was diagnosed with hypertension in 1995 after moving to [place].  He establish care with Dr. Fletcher Anon 12/2017.  Renal arterial duplex 12/2017 without evidence of renal artery stenosis.  BP improved after increasing carvedilol and amlodipine.  It was felt he had a component of whitecoat hypertension given significant discrepancy in blood pressure readings at home and in office.  He was last seen in office 06/09/2020.  He continued to note good blood pressure  control at home.  Following the second COVID-19 vaccine, he noted worsening polymyalgia/arthralgias with difficulty standing due to leg pain and weakness.  He occasionally noted substernal discomfort.  He saw a rheumatology and was placed on prednisone and Cymbalta.  He noted significant improvement in symptoms.  He was exercising again and performing tai chi.  From a cardiac standpoint, no significant CP with exertion. Carvedilol was increased to 37.5 mg twice daily.  Eliquis was added.  Echo was recommended.  If still in atrial fibrillation return to clinic, cardioversion recommended.  He reports today that his prednisone  taper is 2 months in length.  Today, 07/15/2020, he returns to clinic and note he is overall doing well since his last visit.  He was tolerating the above medication changes.  He does have several questions regarding his medications and their interactions and indications, answered today.  No reported chest pain or shortness of breath at rest or with exertion.  He continues to exercise and perform tai chi without concerning symptoms.  No presyncope or syncope.  He denies tachypalpitations, though he has noted elevated rate since starting prednisone.  No signs or symptoms of volume overload, though he has noted some facial swelling since starting prednisone.  He has intermittent lower extremity swelling around his ankles.  No other signs or symptoms of volume overload reported.  He denies any signs or symptoms of bleeding on anticoagulation and reports medication compliance.  He is monitoring his blood pressure and heart rate at home, and he has noted that he does have elevated pressure, in addition to heart rate since starting on the prednisone.    BP log  December 15th, 126/76 23rd 144/92, 82 27th 128/89, 77 29th, 113/82, 79  January 2022 4th, 134/88, 84 8th 110/70, 65 10th, 120/85, 80 17th 132/89, 87  Home Medications    Current Outpatient Medications on File Prior to Visit  Medication Sig Dispense Refill   amLODipine (NORVASC) 10 MG tablet Take 1 tablet (10 mg total) by mouth daily. 90 tablet 0   apixaban (ELIQUIS) 5 MG TABS tablet Take 1 tablet (5 mg total) by mouth 2 (two) times daily. 180 tablet 3   carvedilol (COREG) 6.25 MG tablet Take 1.5 tablets (9.375 mg total) by mouth 2 (two) times daily with a meal. 270 tablet 3   Cholecalciferol (VITAMIN D) 2000 units tablet Take 2,000 Units by mouth daily.     Coenzyme Q10 (CO Q-10) 100 MG CAPS Take by mouth daily.     DULoxetine (CYMBALTA) 20 MG capsule Take 20 mg by mouth daily.     Glucosamine 500 MG CAPS daily.     Magnesium 100  MG CAPS Take by mouth daily.     Magnesium Citrate 100 MG TABS See admin instructions.     Misc Natural Products (OSTEO BI-FLEX JOINT SHIELD PO) Take by mouth daily.     Multiple Vitamin (MULTIVITAMIN) capsule Take 1 capsule by mouth daily.     mupirocin ointment (BACTROBAN) 2 % Apply to affected area daily 30 g 0   OLIVE LEAF PO Take by mouth daily.     Omega-3 Fatty Acids (FISH OIL PO) Take by mouth daily.     predniSONE (DELTASONE) 5 MG tablet Prednisone 5 mg (4 tablets) for 14 day, 5 mg (3 tablets) for 60 days.     TURMERIC CURCUMIN PO Take by mouth daily.     valsartan-hydrochlorothiazide (DIOVAN-HCT) 320-25 MG tablet Take 1 tablet by mouth daily.     No  current facility-administered medications on file prior to visit.    Review of Systems    He denies chest pain, dyspnea, pnd, orthopnea, n, v, dizziness, syncope, increased edema from previously reported occasional mild edema, weight gain, or early satiety.  He has started taking a prednisone taper for polyarthralgias, improved since starting prednisone.  No recurrence of previously described substernal discomfort.  He does note increased heart rate and pressures with prednisone, as well as some facial swelling.  All other systems reviewed and are otherwise negative except as noted above.  Physical Exam    VS:  BP 140/82    Pulse (!) 106    Ht 5' 11"  (1.803 m)    Wt 212 lb (96.2 kg)    BMI 29.57 kg/m  , BMI Body mass index is 29.57 kg/m. GEN: Well nourished, well developed, in no acute distress. HEENT: normal. Neck: Supple, no JVD, carotid bruits, or masses. Cardiac: IRIR with tachycardic rate, no murmurs, rubs, or gallops. No clubbing, cyanosis. Moderate to 1+ LEE (reports unchanged from his clinic visit with Murray Hodgkins, NP).  Radials/DP/PT 2+ and equal bilaterally.  Respiratory:  Respirations regular and unlabored, clear to auscultation bilaterally. GI: Soft, nontender, nondistended, BS + x 4. MS: no deformity or  atrophy. Skin: warm and dry, no rash. Neuro:  Strength and sensation are intact. Psych: Normal affect.  Accessory Clinical Findings    ECG personally reviewed by me today - atrial fibrillation with RVR, LAD, LAFB, ventricular rate 106bpm, QRS 153m - no acute changes.  VITALS Reviewed today   Temp Readings from Last 3 Encounters:  No data found for Temp   BP Readings from Last 3 Encounters:  07/15/20 140/82  06/09/20 126/76  06/03/19 (!) 150/70   Pulse Readings from Last 3 Encounters:  07/15/20 (!) 106  06/09/20 91  06/03/19 69    Wt Readings from Last 3 Encounters:  07/15/20 212 lb (96.2 kg)  06/09/20 218 lb 8 oz (99.1 kg)  06/03/19 213 lb 8 oz (96.8 kg)     LABS  reviewed today   Lab Results  Component Value Date   WBC 9.7 06/09/2020   HGB 15.4 06/09/2020   HCT 45.5 06/09/2020   MCV 90 06/09/2020   PLT 268 06/09/2020   Lab Results  Component Value Date   CREATININE 1.08 06/09/2020   BUN 20 06/09/2020   NA 139 06/09/2020   K 4.2 06/09/2020   CL 98 06/09/2020   CO2 26 06/09/2020   No results found for: ALT, AST, GGT, ALKPHOS, BILITOT No results found for: CHOL, HDL, LDLCALC, LDLDIRECT, TRIG, CHOLHDL  No results found for: HGBA1C Lab Results  Component Value Date   TSH 1.470 06/09/2020     STUDIES/PROCEDURES reviewed today   Echo 06/10/2020 1. Left ventricular ejection fraction, by estimation, is 55 %. The left  ventricle has normal function. The left ventricle has no regional wall  motion abnormalities. Left ventricular diastolic parameters are  indeterminate.  2. Right ventricular systolic function is normal. The right ventricular  size is normal.  3. Tricuspid valve regurgitation is mild to moderate.  4. Mild mitral valve regurgitation.  5. Rhythm is atrial fibrillation  [place]orearenal arteries bilateral 01/18/2018 Renal:  Right: Abnormal size right renal artery. Normal right Resisitive     Index. Abnormal cortical thickness of right  kidney. No     evidence of right renal artery stenosis. Cyst(s) noted. There     is an avascular cystic area in the upper  pole measuring, 2.3     x 2.1 cm. Another avascular area in the upper-posterior     portion measures, 7.2 x 6.2 x 6.5 cm.  Left: No evidence of left renal artery stenosis. Normal size of     left kidney. Normal left Resistive Index. Abnormal cortical     thickness of the left kidney.  Mesenteric:  Normal Celiac artery and Superior Mesenteric artery findings.     Assessment & Plan    Atrial fibrillation with RVR -- Remains in atrial fibrillation with RVR today.  Asymptomatic.  As previously noted, duration unknown and newly diagnosed during 06/09/2020 [facility] clinic visit.  05/2020 echo as above with EF 55% and NR WMA.  Reported at that time rates typically trend in the 70s to 80s.  Today, ventricular rates are elevated into the low 100s.  Notes home rates 70s to 80s.    Increase to Coreg to 12.5 mg twice daily for additional rate control.    Continued Eliquis 5 mg twice daily for [place].  Currently therapeutic.  CHA2DS2-VASc score of at least 3.  No signs or symptoms of bleeding.  He reports compliance.  Obtain repeat CBC and BMET.  Labs to be obtained based on timing of cardioversion as below.  DCCV to be scheduled with Dr. Fletcher Anon, given he remains in atrial fibrillation with RVR today.  Reached out to Dr. Fletcher Anon regarding timing, given his current prednisone taper.  Given the length of his prednisone taper, recommendation was to proceed with DCCV now from a cardiovascular standpoint.[id number]  The risks (stroke, cardiac arrhythmias rarely resulting in the need for a temporary or permanent pacemaker, skin irritation or burns and complications associated with conscious sedation including aspiration, arrhythmia, respiratory failure and death), benefits (restoration of normal sinus rhythm) and alternatives of a direct current cardioversion were explained in  detail to Mr. Ozanich and he agrees to proceed.   Essential hypertension -- BP elevated today.  Increased to Coreg 12.5 mg twice daily for additional support of both BP and heart rate.  Continue ACE inhibitor, calcium channel blocker, diuretic therapy.  Discussed that prednisone may be influencing his elevated BP and ventricular rates.  Recommendation is to call the office if BP consistently over 130/80, ventricular rates consistently over 110 bpm, s/sx of volume overload.  Monitor fluid and salt intake.  Low salt/heart healthy diet.  Continue activity as tolerated.  Continue to monitor with daily BP.  Chest tightness, resolved -- Denies recurrence since his previous episodes over the summer.  No chest pain or shortness of breath at rest or with exertion.  Echo as above with EF normal, NR WMA.  EKG today without acute ST/T changes.  As previously noted, patient attributed to his polymyalgias.  No further ischemic work-up at this time, given echo as above and no recurrence of symptoms.  Polyarthralgia/polymyalgia -- Continue to follow with rheumatology as directed.  Currently on a prednisone taper for 2 months.  He does note some additional swelling, elevated rates, elevated BP on prednisone.  He will continue to monitor this and call the office if any increased edema, BP consistently over 130/80, or ventricular rates consistently over 110s.  Medication changes: Increase to carvedilol 12.5 mg twice daily Labs ordered: Repeat BMET, CBC to be obtained based on timing of cardioversion. Studies / Imaging ordered: DCCV with Dr. Fletcher Anon Disposition: RTC 1 week after DCCV     Arvil Chaco, PA-C 07/15/2020

## 2020-07-15 NOTE — H&P (View-Only) (Signed)
Office Visit    Patient Name: Ian Larsen Date of Encounter: 07/15/2020  Primary Care Provider:  Antony Contras, MD Primary Cardiologist:  Kathlyn Sacramento, MD  Chief Complaint    Chief Complaint  Patient presents with  . Follow-up    1 month  Pt wondering if we can refill and follow Valsartan--states he has plenty for right now, but previously followed by PCP. Pt would like for [place] to follow this instead. Pt states no new cardiac Sx.     86 year old male with history of hypertension, spinal stenosis, prostate cancer, borderline diabetes, and who presents for follow-up after found to be in new onset atrial fibrillation 06/09/2020.  Past Medical History    Past Medical History:  Diagnosis Date  . Fusion of spine of cervical region 2005  . Hypertension    a. 12/2017 Renal artery duplex: No renal artery stenosis. Nl Celiac and SMA artery findings.  . Polymyalgia rheumatica ([place])   . Prostate cancer ([place]) 1999   a. 1999 s/p radical prostatectomy and radical lymph node resections.   . Renal cyst, right    a. 12/2017 Renal Duplex: incidental finding of 2.3 x 2.1 cm avascular cystic area in upper pole and 7.2 x 6.2 x 6.5 cm avascular cyst in upper-posterior portion of right kidney.   Past Surgical History:  Procedure Laterality Date  . CERVICAL FUSION    . HERNIA REPAIR    . PROSTATECTOMY      Allergies  Allergies  Allergen Reactions  . Amlodipine Other (See Comments)    fatigue  . Amlodipine Besylate     Other reaction(s): fatigue  . Fesoterodine Other (See Comments)  . Fesoterodine Fumarate Er Other (See Comments)  . Fluviral [Influenza Virus Vaccine]   . Gabapentin Itching    Other reaction(s): itching  . Hydrocodone-Acetaminophen     Other reaction(s): dizzy  . Influenza Vaccines     Other reaction(s): hyperthermia to 105 and rash  . Mirabegron     Other reaction(s): Unknown  . Neomycin Other (See Comments)    Other reaction(s): Other (See Comments),  rash rash   . Tape Dermatitis  . Tramadol Nausea Only    Other reaction(s): Dizziness  . Tramadol Hcl     Other reaction(s): dizzy, nausea  . Vicodin [Hydrocodone-Acetaminophen] Nausea And Vomiting    Patient states this made him dizzy and felt horrible.  . Zoster Vaccine Live Other (See Comments)    Patient does not wish to take because of his neomycin allergy Other reaction(s): patient does not wish to take because of his neomycin allergy  . Citric Acid Rash  . Other Other (See Comments) and Rash    Hyperthermia to 105 and rash Ascetic acid from products like vinegar  . Sulfa Antibiotics Rash and Other (See Comments)  . Vinegar [Acetic Acid] Rash    History of Present Illness    Ian Larsen is a 86 y.o. male with PMH as above.  He is retired Astronomer, originally from [place].  He was diagnosed with hypertension in 1995 after moving to [place].  He establish care with Dr. Fletcher Anon 12/2017.  Renal arterial duplex 12/2017 without evidence of renal artery stenosis.  BP improved after increasing carvedilol and amlodipine.  It was felt he had a component of whitecoat hypertension given significant discrepancy in blood pressure readings at home and in office.  He was last seen in office 06/09/2020.  He continued to note good blood pressure control at  home.  Following the second COVID-19 vaccine, he noted worsening polymyalgia/arthralgias with difficulty standing due to leg pain and weakness.  He occasionally noted substernal discomfort.  He saw a rheumatology and was placed on prednisone and Cymbalta.  He noted significant improvement in symptoms.  He was exercising again and performing tai chi.  From a cardiac standpoint, no significant CP with exertion. Carvedilol was increased to 37.5 mg twice daily.  Eliquis was added.  Echo was recommended.  If still in atrial fibrillation return to clinic, cardioversion recommended.  He reports today that his prednisone  taper is 2 months in length.  Today, 07/15/2020, he returns to clinic and note he is overall doing well since his last visit.  He was tolerating the above medication changes.  He does have several questions regarding his medications and their interactions and indications, answered today.  No reported chest pain or shortness of breath at rest or with exertion.  He continues to exercise and perform tai chi without concerning symptoms.  No presyncope or syncope.  He denies tachypalpitations, though he has noted elevated rate since starting prednisone.  No signs or symptoms of volume overload, though he has noted some facial swelling since starting prednisone.  He has intermittent lower extremity swelling around his ankles.  No other signs or symptoms of volume overload reported.  He denies any signs or symptoms of bleeding on anticoagulation and reports medication compliance.  He is monitoring his blood pressure and heart rate at home, and he has noted that he does have elevated pressure, in addition to heart rate since starting on the prednisone.    BP log  December 15th, 126/76 23rd 144/92, 82 27th 128/89, 77 29th, 113/82, 79  January 2022 4th, 134/88, 84 8th 110/70, 65 10th, 120/85, 80 17th 132/89, 87  Home Medications    Current Outpatient Medications on File Prior to Visit  Medication Sig Dispense Refill  . amLODipine (NORVASC) 10 MG tablet Take 1 tablet (10 mg total) by mouth daily. 90 tablet 0  . apixaban (ELIQUIS) 5 MG TABS tablet Take 1 tablet (5 mg total) by mouth 2 (two) times daily. 180 tablet 3  . carvedilol (COREG) 6.25 MG tablet Take 1.5 tablets (9.375 mg total) by mouth 2 (two) times daily with a meal. 270 tablet 3  . Cholecalciferol (VITAMIN D) 2000 units tablet Take 2,000 Units by mouth daily.    . Coenzyme Q10 (CO Q-10) 100 MG CAPS Take by mouth daily.    . DULoxetine (CYMBALTA) 20 MG capsule Take 20 mg by mouth daily.    . Glucosamine 500 MG CAPS daily.    . Magnesium 100  MG CAPS Take by mouth daily.    . Magnesium Citrate 100 MG TABS See admin instructions.    . Misc Natural Products (OSTEO BI-FLEX JOINT SHIELD PO) Take by mouth daily.    . Multiple Vitamin (MULTIVITAMIN) capsule Take 1 capsule by mouth daily.    . mupirocin ointment (BACTROBAN) 2 % Apply to affected area daily 30 g 0  . OLIVE LEAF PO Take by mouth daily.    . Omega-3 Fatty Acids (FISH OIL PO) Take by mouth daily.    . predniSONE (DELTASONE) 5 MG tablet Prednisone 5 mg (4 tablets) for 14 day, 5 mg (3 tablets) for 60 days.    . TURMERIC CURCUMIN PO Take by mouth daily.    . valsartan-hydrochlorothiazide (DIOVAN-HCT) 320-25 MG tablet Take 1 tablet by mouth daily.     No current facility-administered  medications on file prior to visit.    Review of Systems    He denies chest pain, dyspnea, pnd, orthopnea, n, v, dizziness, syncope, increased edema from previously reported occasional mild edema, weight gain, or early satiety.  He has started taking a prednisone taper for polyarthralgias, improved since starting prednisone.  No recurrence of previously described substernal discomfort.  He does note increased heart rate and pressures with prednisone, as well as some facial swelling.  All other systems reviewed and are otherwise negative except as noted above.  Physical Exam    VS:  BP 140/82   Pulse (!) 106   Ht 5' 11"  (1.803 m)   Wt 212 lb (96.2 kg)   BMI 29.57 kg/m  , BMI Body mass index is 29.57 kg/m. GEN: Well nourished, well developed, in no acute distress. HEENT: normal. Neck: Supple, no JVD, carotid bruits, or masses. Cardiac: IRIR with tachycardic rate, no murmurs, rubs, or gallops. No clubbing, cyanosis. Moderate to 1+ LEE (reports unchanged from his clinic visit with Murray Hodgkins, NP).  Radials/DP/PT 2+ and equal bilaterally.  Respiratory:  Respirations regular and unlabored, clear to auscultation bilaterally. GI: Soft, nontender, nondistended, BS + x 4. MS: no deformity or  atrophy. Skin: warm and dry, no rash. Neuro:  Strength and sensation are intact. Psych: Normal affect.  Accessory Clinical Findings    ECG personally reviewed by me today - atrial fibrillation with RVR, LAD, LAFB, ventricular rate 106bpm, QRS 156m - no acute changes.  VITALS Reviewed today   Temp Readings from Last 3 Encounters:  No data found for Temp   BP Readings from Last 3 Encounters:  07/15/20 140/82  06/09/20 126/76  06/03/19 (!) 150/70   Pulse Readings from Last 3 Encounters:  07/15/20 (!) 106  06/09/20 91  06/03/19 69    Wt Readings from Last 3 Encounters:  07/15/20 212 lb (96.2 kg)  06/09/20 218 lb 8 oz (99.1 kg)  06/03/19 213 lb 8 oz (96.8 kg)     LABS  reviewed today   Lab Results  Component Value Date   WBC 9.7 06/09/2020   HGB 15.4 06/09/2020   HCT 45.5 06/09/2020   MCV 90 06/09/2020   PLT 268 06/09/2020   Lab Results  Component Value Date   CREATININE 1.08 06/09/2020   BUN 20 06/09/2020   NA 139 06/09/2020   K 4.2 06/09/2020   CL 98 06/09/2020   CO2 26 06/09/2020   No results found for: ALT, AST, GGT, ALKPHOS, BILITOT No results found for: CHOL, HDL, LDLCALC, LDLDIRECT, TRIG, CHOLHDL  No results found for: HGBA1C Lab Results  Component Value Date   TSH 1.470 06/09/2020     STUDIES/PROCEDURES reviewed today   Echo 06/10/2020 1. Left ventricular ejection fraction, by estimation, is 55 %. The left  ventricle has normal function. The left ventricle has no regional wall  motion abnormalities. Left ventricular diastolic parameters are  indeterminate.  2. Right ventricular systolic function is normal. The right ventricular  size is normal.  3. Tricuspid valve regurgitation is mild to moderate.  4. Mild mitral valve regurgitation.  5. Rhythm is atrial fibrillation  [place]orearenal arteries bilateral 01/18/2018 Renal:  Right: Abnormal size right renal artery. Normal right Resisitive     Index. Abnormal cortical thickness of right  kidney. No     evidence of right renal artery stenosis. Cyst(s) noted. There     is an avascular cystic area in the upper pole measuring, 2.3  x 2.1 cm. Another avascular area in the upper-posterior     portion measures, 7.2 x 6.2 x 6.5 cm.  Left: No evidence of left renal artery stenosis. Normal size of     left kidney. Normal left Resistive Index. Abnormal cortical     thickness of the left kidney.  Mesenteric:  Normal Celiac artery and Superior Mesenteric artery findings.     Assessment & Plan    Atrial fibrillation with RVR -- Remains in atrial fibrillation with RVR today.  Asymptomatic.  As previously noted, duration unknown and newly diagnosed during 06/09/2020 [facility] clinic visit.  05/2020 echo as above with EF 55% and NR WMA.  Reported at that time rates typically trend in the 70s to 80s.  Today, ventricular rates are elevated into the low 100s.  Notes home rates 70s to 80s.    Increase to Coreg to 12.5 mg twice daily for additional rate control.    Continued Eliquis 5 mg twice daily for [place].  Currently therapeutic.  CHA2DS2-VASc score of at least 3.  No signs or symptoms of bleeding.  He reports compliance.  Obtain repeat CBC and BMET.  Labs to be obtained based on timing of cardioversion as below.  DCCV to be scheduled with Dr. Fletcher Anon, given he remains in atrial fibrillation with RVR today.  Reached out to Dr. Fletcher Anon regarding timing, given his current prednisone taper.  Given the length of his prednisone taper, recommendation was to proceed with DCCV now from a cardiovascular standpoint.[id number]}  The risks (stroke, cardiac arrhythmias rarely resulting in the need for a temporary or permanent pacemaker, skin irritation or burns and complications associated with conscious sedation including aspiration, arrhythmia, respiratory failure and death), benefits (restoration of normal sinus rhythm) and alternatives of a direct current cardioversion were explained in  detail to Mr. Coopman and he agrees to proceed.   Essential hypertension -- BP elevated today.  Increased to Coreg 12.5 mg twice daily for additional support of both BP and heart rate.  Continue ACE inhibitor, calcium channel blocker, diuretic therapy.  Discussed that prednisone may be influencing his elevated BP and ventricular rates.  Recommendation is to call the office if BP consistently over 130/80, ventricular rates consistently over 110 bpm, s/sx of volume overload.  Monitor fluid and salt intake.  Low salt/heart healthy diet.  Continue activity as tolerated.  Continue to monitor with daily BP.  Chest tightness, resolved -- Denies recurrence since his previous episodes over the summer.  No chest pain or shortness of breath at rest or with exertion.  Echo as above with EF normal, NR WMA.  EKG today without acute ST/T changes.  As previously noted, patient attributed to his polymyalgias.  No further ischemic work-up at this time, given echo as above and no recurrence of symptoms.  Polyarthralgia/polymyalgia -- Continue to follow with rheumatology as directed.  Currently on a prednisone taper for 2 months.  He does note some additional swelling, elevated rates, elevated BP on prednisone.  He will continue to monitor this and call the office if any increased edema, BP consistently over 130/80, or ventricular rates consistently over 110s.  Medication changes: Increase to carvedilol 12.5 mg twice daily Labs ordered: Repeat BMET, CBC to be obtained based on timing of cardioversion. Studies / Imaging ordered: DCCV with Dr. Fletcher Anon Disposition: RTC 1 week after DCCV     Arvil Chaco, PA-C 07/15/2020

## 2020-07-15 NOTE — Patient Instructions (Signed)
Medication Instructions:  Your physician has recommended you make the following change in your medication:   INCREASE Carvedilol to 12.5 mg twice daily.  *If you need a refill on your cardiac medications before your next appointment, please call your pharmacy*   Lab Work: BMP today If you have labs (blood work) drawn today and your tests are completely normal, you will receive your results only by: Marland Kitchen MyChart Message (if you have MyChart) OR . A paper copy in the mail If you have any lab test that is abnormal or we need to change your treatment, we will call you to review the results.   Testing/Procedures: Your physician has recommended that you have a Cardioversion (DCCV). Electrical Cardioversion uses a jolt of electricity to your heart either through paddles or wired patches attached to your chest. This is a controlled, usually prescheduled, procedure. Defibrillation is done under light anesthesia in the hospital, and you usually go home the day of the procedure. This is done to get your heart back into a normal rhythm. You are not awake for the procedure. Please see the instruction sheet given to you today.  We will talk with Dr. Fletcher Anon and call you to schedule the appt.    Follow-Up: At [facility], you and your health needs are our priority.  As part of our continuing mission to provide you with exceptional heart care, we have created designated Provider Care Teams.  These Care Teams include your primary Cardiologist (physician) and Advanced Practice Providers (APPs -  Physician Assistants and Nurse Practitioners) who all work together to provide you with the care you need, when you need it.  We recommend signing up for the patient portal called "MyChart".  Sign up information is provided on this After Visit Summary.  MyChart is used to connect with patients for Virtual Visits (Telemedicine).  Patients are able to view lab/test results, encounter notes, upcoming appointments, etc.   Non-urgent messages can be sent to your provider as well.   To learn more about what you can do with MyChart, go to [email or web address].    Your next appointment:   1 week after cardioversion   The format for your next appointment:   In Person  Provider:   You may see Kathlyn Sacramento, MD or one of the following Advanced Practice Providers on your designated Care Team:    Murray Hodgkins, NP  Christell Faith, PA-C  Marrianne Mood, PA-C  Cadence [place], [place]  Laurann Montana, NP    Other Instructions N/A

## 2020-07-16 ENCOUNTER — Telehealth: Payer: Self-pay | Admitting: *Deleted

## 2020-07-16 DIAGNOSIS — Z01810 Encounter for preprocedural cardiovascular examination: Secondary | ICD-10-CM

## 2020-07-16 DIAGNOSIS — I4891 Unspecified atrial fibrillation: Secondary | ICD-10-CM

## 2020-07-16 LAB — BASIC METABOLIC PANEL
BUN/Creatinine Ratio: 17 (ref 10–24)
BUN: 19 mg/dL (ref 8–27)
CO2: 29 mmol/L (ref 20–29)
Calcium: 9.8 mg/dL (ref 8.6–10.2)
Chloride: 97 mmol/L (ref 96–106)
Creatinine, Ser: 1.12 mg/dL (ref 0.76–1.27)
GFR calc Af Amer: 70 mL/min/{1.73_m2} (ref >59)
GFR calc non Af Amer: 60 mL/min/{1.73_m2} (ref >59)
Glucose: 119 mg/dL — ABNORMAL HIGH (ref 65–99)
Potassium: 4.8 mmol/L (ref 3.5–5.2)
Sodium: 139 mmol/L (ref 134–144)

## 2020-07-16 NOTE — Telephone Encounter (Signed)
-----   Message from Arvil Chaco, PA-C sent at 07/16/2020  3:55 PM EST ----- Regarding: FW: DCCV timing on prednisone for 60d Greetings!  Can we schedule this pt for DCCV for atrial fibrillation with Dr. Fletcher Anon at his earliest availability?  Pt was consented during visit and is aware we are calling him to schedule.  Thank you!  ----- Message ----- From: Wellington Hampshire, MD Sent: 07/16/2020   1:28 PM EST To: Arvil Chaco, PA-C Subject: RE: DCCV timing on prednisone for 60d          Prednisone might increase his risk of recurrent atrial fibrillation.  However, it sounds like we cannot wait 2 months until DCCV so I would suggest to go ahead and schedule it now if it is clinically indicated.  Thanks.   ----- Message ----- From: Arvil Chaco, PA-C Sent: 07/16/2020   1:20 PM EST To: Wellington Hampshire, MD Subject: DCCV timing on prednisone for 60d              Happy Friday!  I saw a patient of yours in new Afib. Plan is to set up for DCCV (now therapeutic on [facility]).   He is starting on prednisone with taper extending for 2 months.   Just verifying it is OK to schedule the DCCV now, given he is on this prednisone.   HR and BP elevated since starting prednisone, so I temporarily increased meds to control both (at least while on prednisone).   Spoke with Sharolyn Douglas - we both decided to first check with you before scheduling :)  Thanks!

## 2020-07-19 NOTE — Telephone Encounter (Signed)
Arvil Chaco, PA-C  07/16/2020 7:23 PM EST Back to Top     Labs show --Stable renal function compared with previous lab. --Elevated potassium at the high range of normal.  Recommend  --Stop any OTC supplements with potassium. --Identify and reduce additional sources of potassium in your diet.  --Repeat BMET to reassess potassium before DCCV. --Obtain CBC before DCCV.  Make sure scheduled for DCCV with Dr. Fletcher Anon.

## 2020-07-19 NOTE — Telephone Encounter (Signed)
Results called to pt. Pt verbalized understanding of results and plan of care. He verbalized understanding of the following preprocedural instructions and when to get repeat labs and Covid testing. Called scheduling and left message.   You are scheduled for a Cardioversion on __01/31/22_____ with Dr.__Arida___ Please arrive at the [facility] at __6:30___ a.m. on the day of your procedure.  DIET INSTRUCTIONS:  Nothing to eat or drink after midnight except your medications with a small sip of water.        1) Labs and COVID test: __on Friday, 07/23/20. 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  COVID PRE- TEST: You will need a COVID TEST prior to the procedure:  LOCATION: [facility].  DATE/TIME:  07/23/20 (anytime between 8 am and 1 pm)  2) Medications:  YOU MAY TAKE ALL of your remaining medications with a small amount of water.  3) Must have a responsible person to drive you home.  4) Bring a current list of your medications and current insurance cards.    If you have any questions after you get home, please call the office at [phone]

## 2020-07-19 NOTE — Telephone Encounter (Signed)
No answer. Left message to call back.   

## 2020-07-23 ENCOUNTER — Other Ambulatory Visit
Admission: RE | Admit: 2020-07-23 | Discharge: 2020-07-23 | Disposition: A | Discharge status: Home / Self Care | Payer: Medicare Other | Source: Home / Self Care | Attending: Physician Assistant | Admitting: Physician Assistant

## 2020-07-23 ENCOUNTER — Telehealth: Payer: Self-pay | Admitting: *Deleted

## 2020-07-23 ENCOUNTER — Other Ambulatory Visit: Payer: Self-pay

## 2020-07-23 ENCOUNTER — Other Ambulatory Visit
Admission: RE | Admit: 2020-07-23 | Discharge: 2020-07-23 | Disposition: A | Discharge status: Home / Self Care | Payer: Medicare Other | Source: Ambulatory Visit | Attending: Cardiovascular Disease | Admitting: Cardiovascular Disease

## 2020-07-23 DIAGNOSIS — Z01812 Encounter for preprocedural laboratory examination: Secondary | ICD-10-CM | POA: Diagnosis present

## 2020-07-23 DIAGNOSIS — Z20822 Contact with and (suspected) exposure to covid-19: Secondary | ICD-10-CM | POA: Insufficient documentation

## 2020-07-23 DIAGNOSIS — I4891 Unspecified atrial fibrillation: Secondary | ICD-10-CM | POA: Insufficient documentation

## 2020-07-23 DIAGNOSIS — Z01810 Encounter for preprocedural cardiovascular examination: Secondary | ICD-10-CM

## 2020-07-23 LAB — BASIC METABOLIC PANEL
Anion gap: 14 (ref 5–15)
BUN: 18 mg/dL (ref 8–23)
CO2: 24 mmol/L (ref 22–32)
Calcium: 9.4 mg/dL (ref 8.9–10.3)
Chloride: 99 mmol/L (ref 98–111)
Creatinine, Ser: 0.95 mg/dL (ref 0.61–1.24)
GFR, Estimated: >60 mL/min (ref >60)
Glucose, Bld: 107 mg/dL — ABNORMAL HIGH (ref 70–99)
Potassium: 3.8 mmol/L (ref 3.5–5.1)
Sodium: 137 mmol/L (ref 135–145)

## 2020-07-23 LAB — CBC WITH DIFFERENTIAL/PLATELET
Abs Immature Granulocytes: 0.04 10*3/uL (ref 0.00–0.07)
Basophils Absolute: 0 10*3/uL (ref 0.0–0.1)
Basophils Relative: 0 %
Eosinophils Absolute: 0.1 10*3/uL (ref 0.0–0.5)
Eosinophils Relative: 1 %
HCT: 43.9 % (ref 39.0–52.0)
Hemoglobin: 15.2 g/dL (ref 13.0–17.0)
Immature Granulocytes: 0 %
Lymphocytes Relative: 7 %
Lymphs Abs: 0.8 10*3/uL (ref 0.7–4.0)
MCH: 31.3 pg (ref 26.0–34.0)
MCHC: 34.6 g/dL (ref 30.0–36.0)
MCV: 90.5 fL (ref 80.0–100.0)
Monocytes Absolute: 0.7 10*3/uL (ref 0.1–1.0)
Monocytes Relative: 6 %
Neutro Abs: 9.6 10*3/uL — ABNORMAL HIGH (ref 1.7–7.7)
Neutrophils Relative %: 86 %
Platelets: 176 10*3/uL (ref 150–400)
RBC: 4.85 MIL/uL (ref 4.22–5.81)
RDW: 13.3 % (ref 11.5–15.5)
WBC: 11.2 10*3/uL — ABNORMAL HIGH (ref 4.0–10.5)
nRBC: 0 % (ref 0.0–0.2)

## 2020-07-23 NOTE — Telephone Encounter (Signed)
Patient awaiting labs now No orders Please review and add

## 2020-07-23 NOTE — Addendum Note (Signed)
Addended by: Annia Belt on: 07/23/2020 11:04 AM   Modules accepted: Orders

## 2020-07-23 NOTE — Telephone Encounter (Signed)
Labs entered.

## 2020-07-23 NOTE — Telephone Encounter (Signed)
Attempted to call pt with results. No answer. Lmtcb. Results also released to [name].

## 2020-07-23 NOTE — Telephone Encounter (Signed)
-----   Message from Arvil Chaco, PA-C sent at 07/23/2020 11:47 AM EST ----- Blood counts show he is not anemic. Continue current anticoagulation. WBC elevated, which could certainly be due to his current course of prednisone. Will send to PCP to keep in the loop. Otherwise, no additional recommendations from that of clinic with plan for upcoming cardioversion.

## 2020-07-24 LAB — SARS CORONAVIRUS 2 (TAT 6-24 HRS): SARS Coronavirus 2: NEGATIVE

## 2020-07-26 ENCOUNTER — Ambulatory Visit
Admission: RE | Admit: 2020-07-26 | Discharge: 2020-07-26 | Disposition: A | Discharge status: Home / Self Care | Payer: Medicare Other | Attending: Cardiovascular Disease | Admitting: Cardiovascular Disease

## 2020-07-26 ENCOUNTER — Other Ambulatory Visit: Payer: Self-pay

## 2020-07-26 ENCOUNTER — Ambulatory Visit: Payer: Medicare Other | Admitting: Registered Nurse

## 2020-07-26 ENCOUNTER — Encounter
Admission: RE | Disposition: A | Discharge status: Home / Self Care | Payer: Self-pay | Source: Home / Self Care | Attending: Cardiovascular Disease

## 2020-07-26 ENCOUNTER — Encounter: Payer: Self-pay | Admitting: Cardiovascular Disease

## 2020-07-26 DIAGNOSIS — Z7901 Long term (current) use of anticoagulants: Secondary | ICD-10-CM | POA: Diagnosis not present

## 2020-07-26 DIAGNOSIS — Z888 Allergy status to other drugs, medicaments and biological substances status: Secondary | ICD-10-CM | POA: Diagnosis not present

## 2020-07-26 DIAGNOSIS — I4891 Unspecified atrial fibrillation: Secondary | ICD-10-CM | POA: Diagnosis present

## 2020-07-26 DIAGNOSIS — I4819 Other persistent atrial fibrillation: Secondary | ICD-10-CM | POA: Diagnosis not present

## 2020-07-26 DIAGNOSIS — Z79899 Other long term (current) drug therapy: Secondary | ICD-10-CM | POA: Diagnosis not present

## 2020-07-26 DIAGNOSIS — Z882 Allergy status to sulfonamides status: Secondary | ICD-10-CM | POA: Diagnosis not present

## 2020-07-26 DIAGNOSIS — M353 Polymyalgia rheumatica: Secondary | ICD-10-CM | POA: Insufficient documentation

## 2020-07-26 DIAGNOSIS — I10 Essential (primary) hypertension: Secondary | ICD-10-CM | POA: Diagnosis not present

## 2020-07-26 HISTORY — PX: CARDIOVERSION: SHX1299

## 2020-07-26 SURGERY — CARDIOVERSION
Anesthesia: General

## 2020-07-26 MED ORDER — PROPOFOL 10 MG/ML IV BOLUS
INTRAVENOUS | Status: DC | PRN
  Administered 2020-07-26: 50 mg via INTRAVENOUS

## 2020-07-26 MED ORDER — PHENYLEPHRINE HCL (PRESSORS) 10 MG/ML IV SOLN
INTRAVENOUS | Status: AC
  Filled 2020-07-26: qty 1

## 2020-07-26 MED ORDER — PROPOFOL 10 MG/ML IV BOLUS
INTRAVENOUS | Status: AC
  Filled 2020-07-26: qty 20

## 2020-07-26 MED ORDER — SODIUM CHLORIDE 0.9 % IV SOLN: INTRAVENOUS | Status: DC | PRN

## 2020-07-26 NOTE — CV Procedure (Signed)
Cardioversion note: A standard informed consent was obtained. Timeout was performed. The pads were placed in the anterior posterior fashion. The patient was given propofol by the anesthesia team.  Successful cardioversion was performed with a 200 J. The patient converted to sinus rhythm. Pre-and post EKGs were reviewed. The patient tolerated the procedure with no immediate complications.  Recommendations: Continue same medications and follow-up in 2-3 weeks.  

## 2020-07-26 NOTE — Interval H&P Note (Signed)
History and Physical Interval Note:  07/26/2020 7:53 AM  Ian Larsen  has presented today for surgery, with the diagnosis of Cardioversion   AFib.  The various methods of treatment have been discussed with the patient and family. After consideration of risks, benefits and other options for treatment, the patient has consented to  Procedure(s): CARDIOVERSION (N/A) as a surgical intervention.  The patient's history has been reviewed, patient examined, no change in status, stable for surgery.  I have reviewed the patient's chart and labs.  Questions were answered to the patient's satisfaction.     Kathlyn Sacramento

## 2020-07-26 NOTE — Transfer of Care (Signed)
Immediate Anesthesia Transfer of Care Note  Patient: Ian Larsen  Procedure(s) Performed: CARDIOVERSION (N/A )  Patient Location: [facility] Cardiac Procedures Unit  Anesthesia Type:General  Level of Consciousness: drowsy  Airway & Oxygen Therapy: Patient Spontanous Breathing and Patient connected to nasal cannula oxygen  Post-op Assessment: Report given to RN and Post -op Vital signs reviewed and stable  Post vital signs: Reviewed and stable  Last Vitals:  Vitals Value Taken Time  BP 141/85 07/26/20 0748  Temp    Pulse 69 07/26/20 0752  Resp 15 07/26/20 0752  SpO2 98 % 07/26/20 0752  Vitals shown include unvalidated device data.  Last Pain:  Vitals:   07/26/20 0711  PainSc: 0-No pain         Complications: No complications documented.

## 2020-07-26 NOTE — Anesthesia Preprocedure Evaluation (Signed)
Anesthesia Evaluation  Patient identified by MRN, date of birth, ID band Patient awake    Reviewed: Allergy & Precautions, H&P , NPO status , Patient's Chart, lab work & pertinent test results, reviewed documented beta blocker date and time   History of Anesthesia Complications Negative for: history of anesthetic complications  Airway Mallampati: I  TM Distance: >3 FB Neck ROM: full    Dental  (+) Dental Advidsory Given, Teeth Intact, Missing   Pulmonary neg pulmonary ROS, former smoker,    Pulmonary exam normal breath sounds clear to auscultation       Cardiovascular Exercise Tolerance: Good hypertension, (-) angina(-) Past MI and (-) Cardiac Stents + dysrhythmias Atrial Fibrillation (-) Valvular Problems/Murmurs Rhythm:regular Rate:Normal     Neuro/Psych negative neurological ROS  negative psych ROS   GI/Hepatic negative GI ROS, Neg liver ROS,   Endo/Other  negative endocrine ROS  Renal/GU negative Renal ROS  negative genitourinary   Musculoskeletal   Abdominal   Peds  Hematology negative hematology ROS (+)   Anesthesia Other Findings Past Medical History: 2005: Fusion of spine of cervical region No date: Hypertension     Comment:  a. 12/2017 Renal artery duplex: No renal artery stenosis.              Nl Celiac and SMA artery findings. No date: Polymyalgia rheumatica ([place]) 1999: Prostate cancer ([place])     Comment:  a. 1999 s/p radical prostatectomy and radical lymph node              resections.  No date: Renal cyst, right     Comment:  a. 12/2017 Renal Duplex: incidental finding of 2.3 x 2.1               cm avascular cystic area in upper pole and 7.2 x 6.2 x               6.5 cm avascular cyst in upper-posterior portion of right              kidney.   Reproductive/Obstetrics negative OB ROS                             Anesthesia Physical Anesthesia Plan  ASA: II  Anesthesia  Plan: General   Post-op Pain Management:    Induction: Intravenous  PONV Risk Score and Plan: 2 and TIVA and Propofol infusion  Airway Management Planned: Natural Airway and Nasal Cannula  Additional Equipment:   Intra-op Plan:   Post-operative Plan:   Informed Consent: I have reviewed the patients History and Physical, chart, labs and discussed the procedure including the risks, benefits and alternatives for the proposed anesthesia with the patient or authorized representative who has indicated his/her understanding and acceptance.     Dental Advisory Given  Plan Discussed with: Anesthesiologist, CRNA and Surgeon  Anesthesia Plan Comments:         Anesthesia Quick Evaluation

## 2020-07-27 ENCOUNTER — Telehealth: Payer: Self-pay | Admitting: *Deleted

## 2020-07-27 NOTE — Telephone Encounter (Signed)
Attempted to call pt to review lab results.  °No answer. Lmtcb.  °

## 2020-07-27 NOTE — Telephone Encounter (Signed)
-----   Message from Arvil Chaco, PA-C sent at 07/23/2020  5:06 PM EST ----- Repeat BMET shows potassium no longer elevated with stable / improved renal function.  No additional recommendations at this time.

## 2020-07-27 NOTE — Anesthesia Postprocedure Evaluation (Signed)
Anesthesia Post Note  Patient: Ian Larsen  Procedure(s) Performed: CARDIOVERSION (N/A )  Patient location during evaluation: [place] Recovery Anesthesia Type: General Level of consciousness: awake and alert Pain management: pain level controlled Vital Signs Assessment: post-procedure vital signs reviewed and stable Respiratory status: spontaneous breathing, nonlabored ventilation, respiratory function stable and patient connected to nasal cannula oxygen Cardiovascular status: blood pressure returned to baseline and stable Postop Assessment: no apparent nausea or vomiting Anesthetic complications: no   No complications documented.   Last Vitals:  Vitals:   07/26/20 0815 07/26/20 0830  BP: 137/88 (!) 142/82  Pulse: 76 78  Resp: (!) 24 18  Temp:    SpO2: 98% 98%    Last Pain:  Vitals:   07/26/20 0830  PainSc: 0-No pain                 Martha Clan

## 2020-07-29 NOTE — Telephone Encounter (Signed)
The patient has been notified of the result 2/1 and verbalized understanding.  All questions (if any) were answered. Darlyne Russian, RN 07/29/2020 10:26 AM

## 2020-07-29 NOTE — Telephone Encounter (Signed)
The patient has been notified of the result 2/1 and verbalized understanding.  All questions (if any) were answered. Abel Ra, RN 07/29/2020 10:26 AM   

## 2020-07-30 ENCOUNTER — Encounter: Payer: Self-pay | Admitting: Cardiovascular Disease

## 2020-07-30 ENCOUNTER — Other Ambulatory Visit: Payer: Self-pay

## 2020-07-30 ENCOUNTER — Ambulatory Visit: Payer: Medicare Other | Admitting: Cardiovascular Disease

## 2020-07-30 VITALS — BP 122/68 | HR 76 | Ht 71.0 in | Wt 208.0 lb

## 2020-07-30 DIAGNOSIS — I4819 Other persistent atrial fibrillation: Secondary | ICD-10-CM

## 2020-07-30 DIAGNOSIS — I10 Essential (primary) hypertension: Secondary | ICD-10-CM | POA: Diagnosis not present

## 2020-07-30 MED ORDER — AMLODIPINE BESYLATE 5 MG PO TABS: 5.0000 mg | ORAL_TABLET | Freq: Every day | ORAL | 2 refills | Status: DC

## 2020-07-30 NOTE — Progress Notes (Signed)
Cardiology Office Note   Date:  07/30/2020   ID:  Ian Larsen, DOB [birth year], MRN [id number]  PCP:  Antony Contras, MD  Cardiologist:   Kathlyn Sacramento, MD   Chief Complaint  Patient presents with  . Other    Follow up post Cardioversion. Patient c.o a little SOB. Meds reviewed verbally with patient.       History of Present Illness: Ian Larsen is a 86 y.o. male who is here today for follow-up visit regarding persistent atrial fibrillation status post recent cardioversion.  He has known history of essential hypertension, spinal stenosis, prostate cancer and borderline diabetes.  He is status post cervical spine surgery in 2006.  He has no history of sleep apnea.  Renal artery duplex in 2019 showed no evidence of renal artery stenosis. The patient was recently diagnosed with atrial fibrillation and started on anticoagulation with Eliquis.  Ventricular rate was somewhat difficult to control. He had an echocardiogram done which showed an EF of 55%, mild mitral regurgitation and mild to moderate tricuspid regurgitation. He has been doing very well with no chest pain.  He reports minimal shortness of breath.  He does have leg edema.  No orthopnea or PND.  Past Medical History:  Diagnosis Date  . Fusion of spine of cervical region 2005  . Hypertension    a. 12/2017 Renal artery duplex: No renal artery stenosis. Nl Celiac and SMA artery findings.  . Polymyalgia rheumatica ([place])   . Prostate cancer ([place]) 1999   a. 1999 s/p radical prostatectomy and radical lymph node resections.   . Renal cyst, right    a. 12/2017 Renal Duplex: incidental finding of 2.3 x 2.1 cm avascular cystic area in upper pole and 7.2 x 6.2 x 6.5 cm avascular cyst in upper-posterior portion of right kidney.    Past Surgical History:  Procedure Laterality Date  . CARDIOVERSION N/A 07/26/2020   Procedure: CARDIOVERSION;  Surgeon: Wellington Hampshire, MD;  Location: [facility] ORS;  Service: Cardiovascular;  Laterality:  N/A;  . CERVICAL FUSION    . HERNIA REPAIR    . PROSTATECTOMY       Current Outpatient Medications  Medication Sig Dispense Refill  . acetaminophen (TYLENOL) 500 MG tablet Take 500 mg by mouth at bedtime.    Marland Kitchen apixaban (ELIQUIS) 5 MG TABS tablet Take 1 tablet (5 mg total) by mouth 2 (two) times daily. 180 tablet 3  . carvedilol (COREG) 12.5 MG tablet Take 1 tablet (12.5 mg total) by mouth 2 (two) times daily with a meal. 180 tablet 1  . Cholecalciferol (VITAMIN D) 2000 units tablet Take 2,000 Units by mouth daily.    . Coenzyme Q10 (CO Q-10) 100 MG CAPS Take 100 mg by mouth daily.    . DULoxetine (CYMBALTA) 20 MG capsule Take 20 mg by mouth every other day.    . Magnesium Citrate 100 MG TABS Take 100 mg by mouth daily.    . Misc Natural Products (OSTEO BI-FLEX JOINT SHIELD PO) Take 1 capsule by mouth daily.    . Multiple Vitamin (MULTIVITAMIN) capsule Take 1 capsule by mouth daily.    . Omega-3 Fatty Acids (FISH OIL PO) Take 800 mg by mouth daily. Flaxseed oil/ 800 mg    . predniSONE (DELTASONE) 5 MG tablet Take 10 mg by mouth daily with breakfast. Taper    . tamsulosin (FLOMAX) 0.4 MG CAPS capsule Take 0.4 mg by mouth daily after supper.    . TURMERIC CURCUMIN PO Take 500  mg by mouth daily.    . valsartan-hydrochlorothiazide (DIOVAN-HCT) 320-25 MG tablet Take 1 tablet by mouth daily.    Marland Kitchen amLODipine (NORVASC) 5 MG tablet Take 1 tablet (5 mg total) by mouth daily. 90 tablet 2   No current facility-administered medications for this visit.    Allergies:   Fesoterodine, Fesoterodine fumarate er, Gabapentin, Hydrocodone-acetaminophen, Mirabegron, Neomycin, Tape, Tramadol hcl, Vicodin [hydrocodone-acetaminophen], Zoster vaccine live, Citric acid, Fluviral [influenza virus vaccine], Influenza vaccines, Sulfa antibiotics, and Vinegar [acetic acid]    Social History:  The patient  reports that he has quit smoking. His smoking use included cigarettes. He quit after 1.00 year of use. He has  never used smokeless tobacco. He reports that he does not drink alcohol and does not use drugs.   Family History:  The patient's family history includes Heart Problems in his mother; Hypertension in his brother and sister.    ROS:  Please see the history of present illness.   Otherwise, review of systems are positive for none.   All other systems are reviewed and negative.    PHYSICAL EXAM: VS:  BP 122/68 (BP Location: Left Arm, Patient Position: Sitting, Cuff Size: Normal)   Pulse 76   Ht 5\' 11"  (1.803 m)   Wt 208 lb (94.3 kg)   BMI 29.01 kg/m  , BMI Body mass index is 29.01 kg/m. GEN: Well nourished, well developed, in no acute distress  HEENT: normal  Neck: no JVD, carotid bruits, or masses Cardiac: RRR; no murmurs, rubs, or gallops, mild leg edema  Respiratory:  clear to auscultation bilaterally, normal work of breathing GI: soft, nontender, nondistended, + BS MS: no deformity or atrophy  Skin: warm and dry, no rash Neuro:  Strength and sensation are intact Psych: euthymic mood, full affect   EKG:  EKG is ordered today. The ekg ordered today demonstrates sinus rhythm with first-degree AV block and left anterior fascicular block.  Recent Labs: 06/09/2020: Magnesium 2.4; TSH 1.470 07/23/2020: BUN 18; Creatinine, Ser 0.95; Hemoglobin 15.2; Platelets 176; Potassium 3.8; Sodium 137    Lipid Panel No results found for: CHOL, TRIG, HDL, CHOLHDL, VLDL, LDLCALC, LDLDIRECT    Wt Readings from Last 3 Encounters:  07/30/20 208 lb (94.3 kg)  07/26/20 209 lb (94.8 kg)  07/15/20 212 lb (96.2 kg)       PAD Screen 01/11/2018  Previous PAD dx? No  Previous surgical procedure? No  Pain with walking? Yes  Subsides with rest? No  Feet/toe relief with dangling? No  Painful, non-healing ulcers? No  Extremities discolored? No      ASSESSMENT AND PLAN:  1.  Persistent atrial fibrillation: Status post recent successful cardioversion.  Currently on Eliquis 5 mg twice daily.   Chads vas score is 3.  He is maintaining in sinus rhythm.  Continue current dose of carvedilol.  Recent labs were unremarkable.  2.  Essential hypertension: Blood pressure is well controlled but he has worsening leg edema likely due to high-dose amlodipine.  I decrease amlodipine to 5 mg daily.  Continue other medications.  3.  Polyarthralgia/polymyalgia followed by rheumatology and currently on prednisone.   Disposition:   FU with me in 6 months  Signed,  Kathlyn Sacramento, MD  07/30/2020 10:19 AM    [place]

## 2020-07-30 NOTE — Patient Instructions (Signed)
Medication Instructions:  Your physician has recommended you make the following change in your medication:   DECREASE Amlodipine to 5 mg daily. An Rx has been sent to your pharmacy.  *If you need a refill on your cardiac medications before your next appointment, please call your pharmacy*   Lab Work: None ordered If you have labs (blood work) drawn today and your tests are completely normal, you will receive your results only by: . MyChart Message (if you have MyChart) OR . A paper copy in the mail If you have any lab test that is abnormal or we need to change your treatment, we will call you to review the results.   Testing/Procedures: None ordered   Follow-Up: At [facility], you and your health needs are our priority.  As part of our continuing mission to provide you with exceptional heart care, we have created designated Provider Care Teams.  These Care Teams include your primary Cardiologist (physician) and Advanced Practice Providers (APPs -  Physician Assistants and Nurse Practitioners) who all work together to provide you with the care you need, when you need it.  We recommend signing up for the patient portal called "MyChart".  Sign up information is provided on this After Visit Summary.  MyChart is used to connect with patients for Virtual Visits (Telemedicine).  Patients are able to view lab/test results, encounter notes, upcoming appointments, etc.  Non-urgent messages can be sent to your provider as well.   To learn more about what you can do with MyChart, go to [email or web address].    Your next appointment:   6 month(s)  The format for your next appointment:   In Person  Provider:   You may see Muhammad Arida, MD or one of the following Advanced Practice Providers on your designated Care Team:    Christopher Berge, NP  Ryan Dunn, PA-C  Jacquelyn Visser, PA-C  Cadence Furth, PA-C  Caitlin Walker, NP    Other Instructions N/A  

## 2020-08-04 ENCOUNTER — Ambulatory Visit: Payer: Medicare Other | Admitting: Dermatology

## 2020-08-04 ENCOUNTER — Other Ambulatory Visit: Payer: Self-pay

## 2020-08-04 DIAGNOSIS — L578 Other skin changes due to chronic exposure to nonionizing radiation: Secondary | ICD-10-CM

## 2020-08-04 DIAGNOSIS — D1800 Hemangioma unspecified site: Secondary | ICD-10-CM

## 2020-08-04 DIAGNOSIS — L720 Epidermal cyst: Secondary | ICD-10-CM

## 2020-08-04 DIAGNOSIS — L905 Scar conditions and fibrosis of skin: Secondary | ICD-10-CM

## 2020-08-04 DIAGNOSIS — Z872 Personal history of diseases of the skin and subcutaneous tissue: Secondary | ICD-10-CM

## 2020-08-04 DIAGNOSIS — Z1283 Encounter for screening for malignant neoplasm of skin: Secondary | ICD-10-CM

## 2020-08-04 DIAGNOSIS — L821 Other seborrheic keratosis: Secondary | ICD-10-CM

## 2020-08-04 NOTE — Progress Notes (Signed)
   Follow-Up Visit   Subjective  Ian Larsen is a 86 y.o. male who presents for the following: Follow-up (Inflamed cyst with abscess of mid back - I&D 04/2020, Doxycycline 100 mg bid). The patient presents for Upper Body Skin Exam (UBSE) for skin cancer screening and mole check.  The following portions of the chart were reviewed this encounter and updated as appropriate:   Tobacco  Allergies  Meds  Problems  Med Hx  Surg Hx  Fam Hx     Review of Systems:  No other skin or systemic complaints except as noted in HPI or Assessment and Plan.  Objective  Well appearing patient in no apparent distress; mood and affect are within normal limits.  All skin waist up examined.  Objective  Mid Back: Dilated pore  Objective  Left Upper Back: Hypertrophic scar   Assessment & Plan    Seborrheic Keratoses - Stuck-on, waxy, tan-brown papules and plaques  - Discussed benign etiology and prognosis. - Observe - Call for any changes  Actinic Damage - chronic, secondary to cumulative UV radiation exposure/sun exposure over time - diffuse scaly erythematous macules with underlying dyspigmentation - Recommend daily broad spectrum sunscreen SPF 30+ to sun-exposed areas, reapply every 2 hours as needed.  - Call for new or changing lesions.  Hemangiomas - Red papules - Discussed benign nature - Observe - Call for any changes  Epidermal inclusion cyst Mid Back  History of inflamed cyst with abscess formation - Resolved  Advised patient that a cyst could reform from the dilated pore in the future. Discussed excision - patient will consider if condition worsens.  Scar Left Upper Back  Secondary to previous cyst removal - Discussed IL injection  Return if symptoms worsen or fail to improve.  I, Ashok Cordia, CMA, am acting as scribe for Sarina Ser, MD .  Documentation: I have reviewed the above documentation for accuracy and completeness, and I agree with the  above.  Sarina Ser, MD

## 2020-08-07 ENCOUNTER — Encounter: Payer: Self-pay | Admitting: Dermatology

## 2021-02-08 ENCOUNTER — Encounter: Payer: Self-pay | Admitting: Cardiovascular Disease

## 2021-02-08 ENCOUNTER — Ambulatory Visit: Payer: Medicare Other | Admitting: Cardiovascular Disease

## 2021-02-08 ENCOUNTER — Other Ambulatory Visit: Payer: Self-pay

## 2021-02-08 VITALS — BP 158/80 | HR 73 | Ht 71.0 in | Wt 213.2 lb

## 2021-02-08 DIAGNOSIS — I4819 Other persistent atrial fibrillation: Secondary | ICD-10-CM | POA: Diagnosis not present

## 2021-02-08 DIAGNOSIS — I10 Essential (primary) hypertension: Secondary | ICD-10-CM | POA: Diagnosis not present

## 2021-02-08 MED ORDER — SPIRONOLACTONE 25 MG PO TABS: 25.0000 mg | ORAL_TABLET | Freq: Every day | ORAL | 2 refills | Status: DC

## 2021-02-08 NOTE — Progress Notes (Signed)
Cardiology Office Note   Date:  02/08/2021   ID:  Ian Larsen, DOB Oct 06, 1934, MRN [id number]  PCP:  Antony Contras, MD  Cardiologist:   Kathlyn Sacramento, MD   Chief Complaint  Patient presents with   Other    6 month f/u pt would like to discuss HCTZ. Meds reviewed verbally with pt.      History of Present Illness: Ian Larsen is a 86 y.o. male who is here today for follow-up visit regarding persistent atrial fibrillation status post cardioversion.  He has known history of essential hypertension, spinal stenosis, prostate cancer and borderline diabetes.  He is status post cervical spine surgery in 2006.  He has no history of sleep apnea.  Renal artery duplex in 2019 showed no evidence of renal artery stenosis. Echocardiogram in December 2021 showed an EF of 55%, mild mitral regurgitation and mild to moderate tricuspid regurgitation. The patient underwent successful cardioversion in January 2022 with restoration of sinus rhythm and no evidence of recurrent atrial fibrillation since then.  He has been doing well with no recent chest pain, shortness of breath or palpitations.  He had previous prostate surgery and complains of frequent urination.  He thinks it is worse since he started taking hydrochlorothiazide and is asking for alternatives.   Past Medical History:  Diagnosis Date   Fusion of spine of cervical region 2005   Hypertension    a. 12/2017 Renal artery duplex: No renal artery stenosis. Nl Celiac and SMA artery findings.   Polymyalgia rheumatica ([facility])    Prostate cancer ([place]) 1999   a. 1999 s/p radical prostatectomy and radical lymph node resections.    Renal cyst, right    a. 12/2017 Renal Duplex: incidental finding of 2.3 x 2.1 cm avascular cystic area in upper pole and 7.2 x 6.2 x 6.5 cm avascular cyst in upper-posterior portion of right kidney.    Past Surgical History:  Procedure Laterality Date   CARDIOVERSION N/A 07/26/2020   Procedure: CARDIOVERSION;   Surgeon: Wellington Hampshire, MD;  Location: [facility] ORS;  Service: Cardiovascular;  Laterality: N/A;   CERVICAL FUSION     HERNIA REPAIR     PROSTATECTOMY       Current Outpatient Medications  Medication Sig Dispense Refill   acetaminophen (TYLENOL) 500 MG tablet Take 500 mg by mouth at bedtime.     amLODipine (NORVASC) 5 MG tablet Take 1 tablet (5 mg total) by mouth daily. 90 tablet 2   apixaban (ELIQUIS) 5 MG TABS tablet Take 1 tablet (5 mg total) by mouth 2 (two) times daily. 180 tablet 3   carvedilol (COREG) 12.5 MG tablet Take 1 tablet (12.5 mg total) by mouth 2 (two) times daily with a meal. 180 tablet 1   Cholecalciferol (VITAMIN D) 2000 units tablet Take 2,000 Units by mouth daily.     Coenzyme Q10 (CO Q-10) 100 MG CAPS Take 100 mg by mouth daily.     DULoxetine (CYMBALTA) 20 MG capsule Take 20 mg by mouth every other day.     hydrochlorothiazide (HYDRODIURIL) 25 MG tablet Take by mouth daily.     Magnesium Citrate 100 MG TABS Take 100 mg by mouth daily.     Misc Natural Products (OSTEO BI-FLEX JOINT SHIELD PO) Take 1 capsule by mouth daily.     Multiple Vitamin (MULTIVITAMIN) capsule Take 1 capsule by mouth daily.     Omega-3 Fatty Acids (FISH OIL PO) Take 800 mg by mouth daily. Flaxseed oil/ 800 mg  tamsulosin (FLOMAX) 0.4 MG CAPS capsule Take 0.4 mg by mouth daily after supper.     TURMERIC CURCUMIN PO Take 500 mg by mouth daily.     valsartan (DIOVAN) 320 MG tablet Take 320 mg by mouth daily.     No current facility-administered medications for this visit.    Allergies:   Fesoterodine, Fesoterodine fumarate er, Gabapentin, Hydrocodone-acetaminophen, Mirabegron, Neomycin, Tape, Tramadol hcl, Vicodin [hydrocodone-acetaminophen], Zoster vaccine live, Citric acid, Fluviral [influenza virus vaccine], Influenza vaccines, Sulfa antibiotics, and Vinegar [acetic acid]    Social History:  The patient  reports that he has quit smoking. His smoking use included cigarettes. He has never  used smokeless tobacco. He reports that he does not drink alcohol and does not use drugs.   Family History:  The patient's family history includes Heart Problems in his mother; Hypertension in his brother and sister.    ROS:  Please see the history of present illness.   Otherwise, review of systems are positive for none.   All other systems are reviewed and negative.    PHYSICAL EXAM: VS:  BP (!) 158/80 (BP Location: Left Arm, Patient Position: Sitting, Cuff Size: Normal)   Pulse 73   Ht '5\' 11"'$  (1.803 m)   Wt 213 lb 4 oz (96.7 kg)   SpO2 98%   BMI 29.74 kg/m  , BMI Body mass index is 29.74 kg/m. GEN: Well nourished, well developed, in no acute distress  HEENT: normal  Neck: no JVD, carotid bruits, or masses Cardiac: RRR; no murmurs, rubs, or gallops, trace leg edema  Respiratory:  clear to auscultation bilaterally, normal work of breathing GI: soft, nontender, nondistended, + BS MS: no deformity or atrophy  Skin: warm and dry, no rash Neuro:  Strength and sensation are intact Psych: euthymic mood, full affect   EKG:  EKG is ordered today. The ekg ordered today demonstrates sinus rhythm with first-degree AV block and a PVC.  There is anterior fascicular block.  Recent Labs: 06/09/2020: Magnesium 2.4; TSH 1.470 07/23/2020: BUN 18; Creatinine, Ser 0.95; Hemoglobin 15.2; Platelets 176; Potassium 3.8; Sodium 137    Lipid Panel No results found for: CHOL, TRIG, HDL, CHOLHDL, VLDL, LDLCALC, LDLDIRECT    Wt Readings from Last 3 Encounters:  02/08/21 213 lb 4 oz (96.7 kg)  07/30/20 208 lb (94.3 kg)  07/26/20 209 lb (94.8 kg)       PAD Screen 01/11/2018  Previous PAD dx? No  Previous surgical procedure? No  Pain with walking? Yes  Subsides with rest? No  Feet/toe relief with dangling? No  Painful, non-healing ulcers? No  Extremities discolored? No      ASSESSMENT AND PLAN:  1.  Persistent atrial fibrillation: He is maintaining in sinus rhythm.  Continue carvedilol.     Currently on Eliquis 5 mg twice daily.  Chads vas score is 3.  He is maintaining in sinus rhythm.  Continue current dose of carvedilol.    2.  Essential hypertension: His blood pressure is elevated today but controlled at home.  He complains of frequent urinations especially at night and thinks hydrochlorothiazide is contributing which is highly likely.  I elected to switch hydrochlorothiazide to spironolactone which has a less diuretic effect.  Check basic metabolic profile in 1 week.  Continue amlodipine and carvedilol.   Disposition:   FU with me in 6 months  Signed,  Kathlyn Sacramento, MD  02/08/2021 1:35 PM    [facility]

## 2021-02-08 NOTE — Patient Instructions (Signed)
Medication Instructions:  Your physician has recommended you make the following change in your medication:   1) STOP HCTZ  2) START Spironolactone 25 mg daily. [facility]  *If you need a refill on your cardiac medications before your next appointment, please call your pharmacy*   Lab Work: Your physician recommends that you return for lab work in: 1 week of taking Spironolactone.  Please have your labs drawn at the [facility] medical mall. No appointment needed.  Lab hours are Mon-Fri 7:30am-6pm  If you have labs (blood work) drawn today and your tests are completely normal, you will receive your results only by: [place] (if you have MyChart) OR A paper copy in the mail If you have any lab test that is abnormal or we need to change your treatment, we will call you to review the results.   Testing/Procedures: None ordered   Follow-Up: At [facility], you and your health needs are our priority.  As part of our continuing mission to provide you with exceptional heart care, we have created designated Provider Care Teams.  These Care Teams include your primary Cardiologist (physician) and Advanced Practice Providers (APPs -  Physician Assistants and Nurse Practitioners) who all work together to provide you with the care you need, when you need it.  We recommend signing up for the patient portal called "MyChart".  Sign up information is provided on this After Visit Summary.  MyChart is used to connect with patients for Virtual Visits (Telemedicine).  Patients are able to view lab/test results, encounter notes, upcoming appointments, etc.  Non-urgent messages can be sent to your provider as well.   To learn more about what you can do with MyChart, go to [email or web address].    Your next appointment:   6 month(s)  The format for your next appointment:   In Person  Provider:   You may see Kathlyn Sacramento, MD or one of the following Advanced Practice  Providers on your designated Care Team:   Murray Hodgkins, NP Christell Faith, PA-C Marrianne Mood, PA-C Cadence Kathlen Mody, [place]   Other Instructions N/A

## 2021-02-28 ENCOUNTER — Other Ambulatory Visit
Admission: RE | Admit: 2021-02-28 | Discharge: 2021-02-28 | Disposition: A | Discharge status: Home / Self Care | Payer: Medicare Other | Attending: Cardiovascular Disease | Admitting: Cardiovascular Disease

## 2021-02-28 DIAGNOSIS — I10 Essential (primary) hypertension: Secondary | ICD-10-CM | POA: Diagnosis present

## 2021-02-28 LAB — BASIC METABOLIC PANEL
Anion gap: 7 (ref 5–15)
BUN: 19 mg/dL (ref 8–23)
CO2: 25 mmol/L (ref 22–32)
Calcium: 9.3 mg/dL (ref 8.9–10.3)
Chloride: 105 mmol/L (ref 98–111)
Creatinine, Ser: 0.95 mg/dL (ref 0.61–1.24)
GFR, Estimated: >60 mL/min (ref >60)
Glucose, Bld: 80 mg/dL (ref 70–99)
Potassium: 4.3 mmol/L (ref 3.5–5.1)
Sodium: 137 mmol/L (ref 135–145)

## 2021-03-18 ENCOUNTER — Telehealth: Payer: Self-pay | Admitting: Cardiovascular Disease

## 2021-03-18 MED ORDER — CARVEDILOL 12.5 MG PO TABS: 12.5000 mg | ORAL_TABLET | Freq: Two times a day (BID) | ORAL | 3 refills | Status: DC

## 2021-03-18 NOTE — Telephone Encounter (Signed)
*  STAT* If patient is at the pharmacy, call can be transferred to refill team.   1. Which medications need to be refilled? (please list name of each medication and dose if known) Carvedilol 12.5mg  1 tablet twice a day  2. Which pharmacy/location (including street and city if local pharmacy) is medication to be sent to? [facility]  3. Do they need a 30 day or 90 day supply? 90 day

## 2021-03-18 NOTE — Telephone Encounter (Signed)
Pt's medication was sent to pt's pharmacy as requested. Confirmation received.  °

## 2021-07-18 ENCOUNTER — Telehealth: Payer: Self-pay | Admitting: Cardiovascular Disease

## 2021-07-18 MED ORDER — APIXABAN 5 MG PO TABS: 5.0000 mg | ORAL_TABLET | Freq: Two times a day (BID) | ORAL | 1 refills | Status: DC

## 2021-07-18 NOTE — Telephone Encounter (Signed)
°*  STAT* If patient is at the pharmacy, call can be transferred to refill team.   1. Which medications need to be refilled? (please list name of each medication and dose if known) Eliquis 5 mg PO BID  2. Which pharmacy/location (including street and city if local pharmacy) is [facility]   3. Do they need a 30 day or 90 day supply? [place]

## 2021-07-18 NOTE — Telephone Encounter (Signed)
Prescription refill request for Eliquis received. Indication: Atrial fib Last office visit: 02/08/21  Rod Can MD Scr: 1.0 on 03/24/21 Age: 87 Weight: 96.7kg  Based on above findings Eliquis 5mg  twice daily is the appropriate dose.  Refill approved.

## 2021-07-18 NOTE — Telephone Encounter (Signed)
Please advise 

## 2021-08-05 ENCOUNTER — Telehealth: Payer: Self-pay | Admitting: Cardiovascular Disease

## 2021-08-05 NOTE — Telephone Encounter (Signed)
°*  STAT* If patient is at the pharmacy, call can be transferred to refill team.   1. Which medications need to be refilled? (please list name of each medication and dose if known) Cymbalta 20 mg once daily   2. Which pharmacy/location (including street and city if local pharmacy) is medication to be sent to? [facility]  3. Do they need a 30 day or 90 day supply? 90 day

## 2021-08-08 NOTE — Telephone Encounter (Signed)
Left voicemail message advising patient to call PCP for refill.

## 2021-08-10 ENCOUNTER — Ambulatory Visit: Payer: Medicare Other | Admitting: Nurse Practitioner

## 2021-08-10 ENCOUNTER — Other Ambulatory Visit: Payer: Self-pay

## 2021-08-10 ENCOUNTER — Encounter: Payer: Self-pay | Admitting: Nurse Practitioner

## 2021-08-10 VITALS — BP 144/94 | HR 92 | Ht 71.0 in | Wt 218.0 lb

## 2021-08-10 DIAGNOSIS — I4819 Other persistent atrial fibrillation: Secondary | ICD-10-CM | POA: Diagnosis not present

## 2021-08-10 DIAGNOSIS — R5383 Other fatigue: Secondary | ICD-10-CM | POA: Diagnosis not present

## 2021-08-10 DIAGNOSIS — I10 Essential (primary) hypertension: Secondary | ICD-10-CM

## 2021-08-10 MED ORDER — CARVEDILOL 12.5 MG PO TABS: 18.7500 mg | ORAL_TABLET | Freq: Two times a day (BID) | ORAL | 3 refills | Status: DC

## 2021-08-10 NOTE — Patient Instructions (Signed)
Medication Instructions:   Your physician has recommended you make the following change in your medication:   INCREASE your carvedilol (Coreg) to 18.75 mg two times per day   *If you need a refill on your cardiac medications before your next appointment, please call your pharmacy*   Lab Work: None ordered  If you have labs (blood work) drawn today and your tests are completely normal, you will receive your results only by: [place] (if you have MyChart) OR A paper copy in the mail If you have any lab test that is abnormal or we need to change your treatment, we will call you to review the results.   Testing/Procedures: None ordered   Follow-Up: At [facility], you and your health needs are our priority.  As part of our continuing mission to provide you with exceptional heart care, we have created designated Provider Care Teams.  These Care Teams include your primary Cardiologist (physician) and Advanced Practice Providers (APPs -  Physician Assistants and Nurse Practitioners) who all work together to provide you with the care you need, when you need it.  We recommend signing up for the patient portal called "MyChart".  Sign up information is provided on this After Visit Summary.  MyChart is used to connect with patients for Virtual Visits (Telemedicine).  Patients are able to view lab/test results, encounter notes, upcoming appointments, etc.  Non-urgent messages can be sent to your provider as well.   To learn more about what you can do with MyChart, go to [email or web address].    Your next appointment:   3-4 week(s)  The format for your next appointment:   In Person  Provider:   Lars Mage, MD    Other Instructions N/A

## 2021-08-10 NOTE — Progress Notes (Signed)
Office Visit    Patient Name: Ian Larsen Date of Encounter: 08/10/2021  Primary Care Provider:  Antony Contras, MD Primary Cardiologist:  Ian Sacramento, MD  Chief Complaint    87 year old male with a history of hypertension, persistent atrial fibrillation, spinal stenosis, prostate cancer, and borderline diabetes, who presents for follow-up related to recurrent atrial fibrillation.  Past Medical History    Past Medical History:  Diagnosis Date   Fusion of spine of cervical region 2005   Hypertension    a. 12/2017 Renal artery duplex: No renal artery stenosis. Nl Celiac and SMA artery findings.   Persistent atrial fibrillation ([name])    a. 05/2020 Echo: EF 55%, no rwma, nl RV fxn, mild-mod TR, mild MR.   Polymyalgia rheumatica ([facility])    Prostate cancer ([place]) 1999   a. 1999 s/p radical prostatectomy and radical lymph node resections.    Renal cyst, right    a. 12/2017 Renal Duplex: incidental finding of 2.3 x 2.1 cm avascular cystic area in upper pole and 7.2 x 6.2 x 6.5 cm avascular cyst in upper-posterior portion of right kidney.   Valvular heart disease    a. 05/2020 Echo: EF 55%. Mild-mod TR, mild MR.   Past Surgical History:  Procedure Laterality Date   CARDIOVERSION N/A 07/26/2020   Procedure: CARDIOVERSION;  Surgeon: Ian Hampshire, MD;  Location: [facility] ORS;  Service: Cardiovascular;  Laterality: N/A;   CERVICAL FUSION     HERNIA REPAIR     PROSTATECTOMY      Allergies  Allergies  Allergen Reactions   Fesoterodine Other (See Comments)    Elevate blood pressure   Fesoterodine Fumarate Er Other (See Comments)    Elevate blood pressure   Gabapentin Itching    Other reaction(s): itching   Hydrocodone-Acetaminophen     Other reaction(s): dizzy   Mirabegron Other (See Comments)    Elevate blood pressure   Neomycin Other (See Comments)    Other reaction(s): Other (See Comments), rash rash    Tape Dermatitis    Please use paper tape   Tramadol Hcl Nausea  Only    Dizzy   Vicodin [Hydrocodone-Acetaminophen] Nausea And Vomiting    Patient states this made him dizzy and felt horrible.   Zoster Vaccine Live Other (See Comments)    Patient does not wish to take because of his neomycin allergy Other reaction(s): patient does not wish to take because of his neomycin allergy   Citric Acid Rash   Fluviral [Influenza Virus Vaccine] Rash    Elevated temperature   Influenza Vaccines Rash    Other reaction(s): hyperthermia    Sulfa Antibiotics Rash   Vinegar [Acetic Acid] Rash    History of Present Illness    87 year old male with above past medical history including persistent atrial fibrillation, hypertension, spinal stenosis, prostate cancer, and borderline diabetes.  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  He was originally diagnosed with hypertension in 1995 and establish care with Dr. Fletcher Larsen in July 2019, after moving to [facility].  Renal arterial duplex was performed in July 2019, which showed no evidence of renal artery stenosis.  It was felt that he had a component of whitecoat syndrome, given history of discrepancy in blood pressure readings at home and in office.  In late 2021, following his second COVID-vaccine, he developed worsening polymyalgia/arthralgia, with significant leg pain and weakness.  He was evaluated by rheumatology in December 2021 and placed on prednisone and Cymbalta with  improvement in symptoms.  He did start to notice some increase in lower extremity swelling while on prednisone and when he was seen in clinic in June 09, 2020, he was noted to be in atrial fibrillation.  His carvedilol dose was titrated and Eliquis 5 mg twice daily was added.  An echocardiogram was performed and showed normal LV function with mild to moderate TR, and mild MR.  He subsequently underwent successful cardioversion on July 26, 2020, and was maintaining sinus rhythm at his last office visit in August  2022, at which time he was doing well.  He had done well following his last visit and remains relatively active.  He notes some degree of chronic fatigue during the day, which he thinks may be coming from his medications.  He generally feels well rested first thing in the morning but notes fatigue later in the morning and often throughout the day.  He can overcome this by forcing himself to do stuff.  He does not necessarily have daytime somnolence.  He says that his wife has told him that he snores at night.  He is not interested in a referral for sleep study.  He notes that his sleep is often disrupted at night related to getting up 3-4 times to use the bathroom.  Each time he voids, he does drink a small cup of water and we discussed that this may be leading to more nocturia.  He has been followed closely by urology and is scheduled for cystoscopy this week.  Finally, over the past 10 days, he has noted an increase in dyspnea on exertion, which is unusual for him.  He is in atrial fibrillation today at 92 bpm.  He was not aware of this.  His heart rates at home have mostly been in the 70s over the past few months.  He denies chest pain, palpitations, PND, orthopnea, dizziness, syncope, or early satiety.  He sometimes notes lower extremity swelling above his sock line.  Home Medications    Current Outpatient Medications  Medication Sig Dispense Refill   acetaminophen (TYLENOL) 500 MG tablet Take 500 mg by mouth at bedtime.     amLODipine (NORVASC) 5 MG tablet Take 1 tablet (5 mg total) by mouth daily. 90 tablet 2   apixaban (ELIQUIS) 5 MG TABS tablet Take 1 tablet (5 mg total) by mouth 2 (two) times daily. 180 tablet 1   Cholecalciferol (VITAMIN D) 2000 units tablet Take 2,000 Units by mouth daily.     Coenzyme Q10 (CO Q-10) 100 MG CAPS Take 100 mg by mouth daily.     DULoxetine (CYMBALTA) 20 MG capsule Take 20 mg by mouth every other day.     Magnesium Citrate 100 MG TABS Take 100 mg by mouth daily.      Misc Natural Products (OSTEO BI-FLEX JOINT SHIELD PO) Take 1 capsule by mouth daily.     Multiple Vitamin (MULTIVITAMIN) capsule Take 1 capsule by mouth daily.     Omega-3 Fatty Acids (FISH OIL PO) Take 800 mg by mouth daily. Flaxseed oil/ 800 mg     tamsulosin (FLOMAX) 0.4 MG CAPS capsule Take 0.4 mg by mouth daily after supper.     TURMERIC CURCUMIN PO Take 500 mg by mouth daily.     valsartan (DIOVAN) 320 MG tablet Take 320 mg by mouth daily.     carvedilol (COREG) 12.5 MG tablet Take 1.5 tablets (18.75 mg total) by mouth 2 (two) times daily with a meal. 270 tablet 3  spironolactone (ALDACTONE) 25 MG tablet Take 1 tablet (25 mg total) by mouth daily. 90 tablet 2   No current facility-administered medications for this visit.     Review of Systems    Chronic issues with fatigue and nocturia.  Over the past 10 days, he has had dyspnea on exertion.  He denies chest pain, palpitations, PND, orthopnea, dizziness, syncope, or early satiety.  Occasionally notes mild ankle edema.  All other systems reviewed and are otherwise negative except as noted above.    Physical Exam    VS:  BP (!) 144/94 (BP Location: Left Arm, Patient Position: Sitting, Cuff Size: Normal)    Pulse 92    Ht 5\' 11"  (1.803 m)    Wt 218 lb (98.9 kg)    SpO2 98%    BMI 30.40 kg/m  , BMI Body mass index is 30.4 kg/m.     GEN: Well nourished, well developed, in no acute distress. HEENT: normal. Neck: Supple, no JVD, carotid bruits, or masses. Cardiac: Irregularly irregular, no murmurs, rubs, or gallops. No clubbing, cyanosis, edema.  Radials/PT 2+ and equal bilaterally.  Respiratory:  Respirations regular and unlabored, clear to auscultation bilaterally. GI: Soft, nontender, nondistended, BS + x 4. MS: no deformity or atrophy. Skin: warm and dry, no rash. Neuro:  Strength and sensation are intact. Psych: Normal affect.  Accessory Clinical Findings    ECG personally reviewed by me today -atrial fibrillation, 92,  PVC, left axis deviation, LAFB - no acute changes.  Lab Results  Component Value Date   WBC 11.2 (H) 07/23/2020   HGB 15.2 07/23/2020   HCT 43.9 07/23/2020   MCV 90.5 07/23/2020   PLT 176 07/23/2020   Lab Results  Component Value Date   CREATININE 0.95 02/28/2021   BUN 19 02/28/2021   NA 137 02/28/2021   K 4.3 02/28/2021   CL 105 02/28/2021   CO2 25 02/28/2021    Assessment & Plan    1.  Recurrent persistent atrial fibrillation: Patient with prior history of atrial fibrillation initially diagnosed in December 2021, status post cardioversion in January 2022.  Over the past 10 days, he has noticed increased dyspnea on exertion and today, he is noted to be in atrial fibrillation at a rate of 92 bpm.  He denies palpitations.  He does check his blood pressure and heart rate regularly at home and his heart rate was 75 on February 12, when he last checked.  He did not notice any irregularity on either his blood pressure cuff or pulse oximeter.  We discussed options for management today including proceeding directly to repeat cardioversion (compliant with Eliquis), adding antiarrhythmic therapy, EP referral for discussion regarding catheter ablation versus antiarrhythmics, or rate control therapy.  He has multiple drug intolerances and seems to be very sensitive to medications, and I am concerned that he would not tolerate amiodarone well.  With advanced age, likely candidate for flecainide.  I suspect he would be a good candidate for Tikosyn, and we discussed this today however, at this time, he neither wishes to be seen in A-fib clinic nor be admitted for 3 days for Tikosyn loading.  He is not currently interested in pursuing repeat cardioversion and in the absence of adding an antiarrhythmic, I am not sure that that would be the best option for him anyway.  We agreed to increase his carvedilol to 18.75 mg twice daily, and arrange for follow-up with Dr. Quentin Ore to discuss further options.  Patient  believes he may  be paroxysmal, based on heart rates recorded at home.  I offered to place a ZIO monitor to assess for paroxysms however, he plans to buy a  Kardia mobile device and will track his rhythm that way instead.  He understands that if symptoms worsen prior to EP office visit, he is to contact us.  He indicates that he has had multiple labs checked by his urologist and primary care provider recently (all reportedly normal) and he is going to see if he can get those records faxed to [place].  2.  Essential hypertension: Blood pressure elevated in clinic but trends in the 1 20-1 30 range at home.  Titrating carvedilol in the setting of above.  Patient will continue to follow at home.  He also takes amlodipine, spironolactone, and valsartan.  3.  Daytime fatigue: Patient reports daily fatigue and lack of energy.  He can often force himself to do things with resolution of fatigue but simply notes "no get up and go."  He attributes this to his medications.  We did discuss how some of his medications may cause fatigue, especially carvedilol.  We discussed sleep hygiene, and he apparently snores, as well as awakens 3-4 times per night in the setting of nocturia.  In the context of daytime fatigue and atrial fibrillation with history of hypertension, I offered him referral for sleep study however, he declined at this time.  We did discuss that a medication holiday to see if symptoms improve could be appropriate but given recurrence of symptomatic atrial fibrillation, now is not the best time to come off of beta-blocker.  4.  Disposition: Patient will have recent labs faxed to [place].  Follow-up with Dr. Alonza Smoker available.  Murray Hodgkins, NP 08/10/2021, 1:35 PM

## 2021-08-11 ENCOUNTER — Ambulatory Visit: Payer: Medicare Other | Admitting: Physician Assistant

## 2021-09-07 ENCOUNTER — Encounter: Payer: Self-pay | Admitting: Cardiology

## 2021-09-07 ENCOUNTER — Other Ambulatory Visit: Payer: Self-pay

## 2021-09-07 ENCOUNTER — Ambulatory Visit: Payer: Medicare Other | Admitting: Cardiology

## 2021-09-07 VITALS — BP 140/80 | HR 88 | Ht 72.0 in | Wt 222.0 lb

## 2021-09-07 DIAGNOSIS — I4819 Other persistent atrial fibrillation: Secondary | ICD-10-CM | POA: Diagnosis not present

## 2021-09-07 DIAGNOSIS — I10 Essential (primary) hypertension: Secondary | ICD-10-CM | POA: Diagnosis not present

## 2021-09-07 NOTE — Patient Instructions (Signed)

## 2021-09-07 NOTE — Progress Notes (Signed)
?Electrophysiology Office Note:   ? ?Date:  09/07/2021  ? ?ID:  [name], DOB [birth year], MRN [id number] ? ?PCP:  Antony Contras, MD  ?[facility] Cardiologist:  Kathlyn Sacramento, MD  ?[facility] Electrophysiologist:  Vickie Epley, MD  ? ?Referring MD: Antony Contras, MD  ? ?Chief Complaint: Atrial fibrillation ? ?History of Present Illness:   ? ?Gerron Guidotti is a 87 y.o. male who presents for an evaluation of atrial fibrillation at the request of Jorja Loa. Their medical history includes persistent atrial fibrillation, hypertension, prostate cancer, diabetes, spinal stenosis.  The patient Sudie Grumbling on August 10, 2021.  He is on Eliquis for stroke prophylaxis.  At that appointment he reported some chronic fatigue.  He has previously had a cardioversion in January 2022.  At the appoint with Gerald Stabs, he reported an increased burden of dyspnea with exertion and was noted to be in atrial fibrillation with a ventricular rate of 92 bpm.  He was referred to discuss rhythm control strategies. ? ?Today he tells me that he is really not symptomatic when in atrial fibrillation.  He tells me that when he had a cardioversion in the past he did not feel any different before or after the cardioversion. ? ? ?  ?Past Medical History:  ?Diagnosis Date  ? Fusion of spine of cervical region 2005  ? Hypertension   ? a. 12/2017 Renal artery duplex: No renal artery stenosis. Nl Celiac and SMA artery findings.  ? Persistent atrial fibrillation ([place])   ? a. 05/2020 Echo: EF 55%, no rwma, nl RV fxn, mild-mod TR, mild MR.  ? Polymyalgia rheumatica ([place])   ? Prostate cancer [facility]) 1999  ? a. 1999 s/p radical prostatectomy and radical lymph node resections.   ? Renal cyst, right   ? a. 12/2017 Renal Duplex: incidental finding of 2.3 x 2.1 cm avascular cystic area in upper pole and 7.2 x 6.2 x 6.5 cm avascular cyst in upper-posterior portion of right kidney.  ? Valvular heart disease   ? a. 05/2020 Echo: EF 55%. Mild-mod TR,  mild MR.  ? ? ?Past Surgical History:  ?Procedure Laterality Date  ? CARDIOVERSION N/A 07/26/2020  ? Procedure: CARDIOVERSION;  Surgeon: Wellington Hampshire, MD;  Location: [facility] ORS;  Service: Cardiovascular;  Laterality: N/A;  ? CERVICAL FUSION    ? HERNIA REPAIR    ? PROSTATECTOMY    ? ? ?Current Medications: ?Current Meds  ?Medication Sig  ? acetaminophen (TYLENOL) 500 MG tablet Take 500 mg by mouth at bedtime.  ? amLODipine (NORVASC) 5 MG tablet Take 1 tablet (5 mg total) by mouth daily.  ? apixaban (ELIQUIS) 5 MG TABS tablet Take 1 tablet (5 mg total) by mouth 2 (two) times daily.  ? carvedilol (COREG) 12.5 MG tablet Take 1.5 tablets (18.75 mg total) by mouth 2 (two) times daily with a meal.  ? Cholecalciferol (VITAMIN D) 2000 units tablet Take 2,000 Units by mouth daily.  ? Coenzyme Q10 (CO Q-10) 100 MG CAPS Take 100 mg by mouth daily.  ? DULoxetine (CYMBALTA) 20 MG capsule Take 20 mg by mouth every other day.  ? GEMTESA 75 MG TABS Take 1 tablet by mouth daily.  ? Magnesium Citrate 100 MG TABS Take 100 mg by mouth daily.  ? Misc Natural Products (OSTEO BI-FLEX JOINT SHIELD PO) Take 1 capsule by mouth daily.  ? Multiple Vitamin (MULTIVITAMIN) capsule Take 1 capsule by mouth daily.  ? Omega-3 Fatty Acids (FISH OIL PO) Take 800 mg  by mouth daily. Flaxseed oil/ 800 mg  ? spironolactone (ALDACTONE) 25 MG tablet Take 1 tablet (25 mg total) by mouth daily.  ? tamsulosin (FLOMAX) 0.4 MG CAPS capsule Take 0.4 mg by mouth daily after supper.  ? TURMERIC CURCUMIN PO Take 500 mg by mouth daily.  ? valsartan (DIOVAN) 320 MG tablet Take 320 mg by mouth daily.  ?  ? ?Allergies:   Fesoterodine, Fesoterodine fumarate er, Gabapentin, Hydrocodone-acetaminophen, Mirabegron, Neomycin, Tape, Tramadol hcl, Vicodin [hydrocodone-acetaminophen], Zoster vaccine live, Citric acid, Fluviral [influenza virus vaccine], Influenza vaccines, Sulfa antibiotics, and Vinegar [acetic acid]  ? ?Social History  ? ?Socioeconomic History  ? Marital  status: Married  ?  Spouse name: Not on file  ? Number of children: Not on file  ? Years of education: Not on file  ? Highest education level: Not on file  ?Occupational History  ? Not on file  ?Tobacco Use  ? Smoking status: Former  ?  Years: 1.00  ?  Types: Cigarettes  ? Smokeless tobacco: Never  ?Vaping Use  ? Vaping Use: Never used  ?Substance and Sexual Activity  ? Alcohol use: Never  ? Drug use: Never  ? Sexual activity: Not on file  ?Other Topics Concern  ? Not on file  ?Social History Narrative  ? Not on file  ? ?Social Determinants of Health  ? ?Financial Resource Strain: Not on file  ?Food Insecurity: Not on file  ?Transportation Needs: Not on file  ?Physical Activity: Not on file  ?Stress: Not on file  ?Social Connections: Not on file  ?  ? ?Family History: ?The patient's family history includes Heart Problems in his mother; Hypertension in his brother and sister. ? ?ROS:   ?Please see the history of present illness.    ?All other systems reviewed and are negative. ? ?EKGs/Labs/Other Studies Reviewed:   ? ?The following studies were reviewed today: ? ? ?EKG:  The ekg ordered today demonstrates atrial fibrillation with a ventricular rate of 88 bpm.  Frequent PVCs. ? ? ?Recent Labs: ?02/28/2021: BUN 19; Creatinine, Ser 0.95; Potassium 4.3; Sodium 137  ?Recent Lipid Panel ?No results found for: CHOL, TRIG, HDL, CHOLHDL, VLDL, LDLCALC, LDLDIRECT ? ?Physical Exam:   ? ?VS:  BP 140/80 (BP Location: Left Arm, Patient Position: Sitting, Cuff Size: Normal)   Pulse 88   Ht 6' (1.829 m)   Wt 222 lb (100.7 kg)   SpO2 98%   BMI 30.11 kg/m?    ? ?Wt Readings from Last 3 Encounters:  ?09/07/21 222 lb (100.7 kg)  ?08/10/21 218 lb (98.9 kg)  ?02/08/21 213 lb 4 oz (96.7 kg)  ?  ? ?GEN:  Well nourished, well developed in no acute distress ?HEENT: Normal ?NECK: No JVD; No carotid bruits ?LYMPHATICS: No lymphadenopathy ?CARDIAC: Irregularly irregular, no murmurs, rubs, gallops ?RESPIRATORY:  Clear to auscultation without  rales, wheezing or rhonchi  ?ABDOMEN: Soft, non-tender, non-distended ?MUSCULOSKELETAL:  No edema; No deformity  ?SKIN: Warm and dry ?NEUROLOGIC:  Alert and oriented x 3 ?PSYCHIATRIC:  Normal affect  ? ? ?  ? ?ASSESSMENT:   ? ?1. Persistent atrial fibrillation ([place])   ?2. Essential hypertension   ? ?PLAN:   ? ?In order of problems listed above: ? ?#Persistent atrial fibrillation ?On Eliquis for stroke prophylaxis.  Continue Coreg for rate control.  I discussed treatment options available for his atrial fibrillation including a continued conservative management strategy with rate control and stroke prophylaxis, rhythm control using antiarrhythmic drugs and rhythm control using  catheter ablation.  I do not think he is a great candidate for antiarrhythmic drugs given his multiple drug intolerances.  I do think he would be a candidate for catheter ablation if he was interested but the patient reports very little symptoms associate with his atrial fibrillation and he has normal left ventricular function making the decision to pursue catheter ablation less clear.  He would like to continue with Eliquis for stroke prophylaxis and continue rate control which I think is very reasonable.  He will follow-up with me on an as-needed basis. ? ?#Hypertension ?Upper limit of normal today.  Continue monitoring blood pressures at home 1-2 times per week and bring those to your primary care physician or primary cardiologist office for review. ? ?Medication Adjustments/Labs and Tests Ordered: ?Current medicines are reviewed at length with the patient today.  Concerns regarding medicines are outlined above.  ?No orders of the defined types were placed in this encounter. ? ?No orders of the defined types were placed in this encounter. ? ? ? ?Signed, ?Lysbeth Galas T. Quentin Ore, MD, [facility], [place] ?09/07/2021 9:11 PM    ?Electrophysiology ?[place] ?

## 2021-09-22 NOTE — Addendum Note (Signed)
Addended by: Britt Bottom on: 09/22/2021 10:36 AM ? ? Modules accepted: Orders ? ?

## 2021-09-27 ENCOUNTER — Encounter: Payer: Self-pay | Admitting: Cardiovascular Disease

## 2021-09-28 ENCOUNTER — Other Ambulatory Visit: Payer: Self-pay

## 2021-09-28 MED ORDER — AMLODIPINE BESYLATE 5 MG PO TABS: 5.0000 mg | ORAL_TABLET | Freq: Every day | ORAL | 3 refills | Status: DC

## 2021-12-07 ENCOUNTER — Encounter: Payer: Self-pay | Admitting: Cardiovascular Disease

## 2021-12-07 ENCOUNTER — Other Ambulatory Visit: Payer: Self-pay | Admitting: Cardiovascular Disease

## 2022-01-05 ENCOUNTER — Encounter: Payer: Self-pay | Admitting: Cardiovascular Disease

## 2022-01-05 ENCOUNTER — Ambulatory Visit: Payer: Medicare Other | Admitting: Cardiovascular Disease

## 2022-01-05 VITALS — BP 128/70 | HR 78 | Ht 71.0 in | Wt 222.5 lb

## 2022-01-05 DIAGNOSIS — I10 Essential (primary) hypertension: Secondary | ICD-10-CM | POA: Diagnosis not present

## 2022-01-05 DIAGNOSIS — I4821 Permanent atrial fibrillation: Secondary | ICD-10-CM

## 2022-01-05 MED ORDER — CARVEDILOL 12.5 MG PO TABS: 12.5000 mg | ORAL_TABLET | Freq: Two times a day (BID) | ORAL | 2 refills | Status: DC

## 2022-01-05 NOTE — Patient Instructions (Signed)
Medication Instructions:  Your physician has recommended you make the following change in your medication:   REDUCE Carvedilol to 12.5 mg twice a day  *If you need a refill on your cardiac medications before your next appointment, please call your pharmacy*   Lab Work: None ordered If you have labs (blood work) drawn today and your tests are completely normal, you will receive your results only by: [place] (if you have MyChart) OR A paper copy in the mail If you have any lab test that is abnormal or we need to change your treatment, we will call you to review the results.   Testing/Procedures: None ordered   Follow-Up: At [facility], you and your health needs are our priority.  As part of our continuing mission to provide you with exceptional heart care, we have created designated Provider Care Teams.  These Care Teams include your primary Cardiologist (physician) and Advanced Practice Providers (APPs -  Physician Assistants and Nurse Practitioners) who all work together to provide you with the care you need, when you need it.  We recommend signing up for the patient portal called "MyChart".  Sign up information is provided on this After Visit Summary.  MyChart is used to connect with patients for Virtual Visits (Telemedicine).  Patients are able to view lab/test results, encounter notes, upcoming appointments, etc.  Non-urgent messages can be sent to your provider as well.   To learn more about what you can do with MyChart, go to [email or web address].    Your next appointment:   Your physician wants you to follow-up in: 6 months You will receive a reminder letter in the mail two months in advance. If you don't receive a letter, please call our office to schedule the follow-up appointment.   The format for your next appointment:   In Person  Provider:   You may see Kathlyn Sacramento, MD or one of the following Advanced Practice Providers on your designated Care Team:    Murray Hodgkins, NP Christell Faith, PA-C Cadence Kathlen Mody, [place]   Other Instructions N/A  Important Information About Sugar

## 2022-01-05 NOTE — Progress Notes (Signed)
Cardiology Office Note   Date:  01/05/2022   ID:  Ian Larsen, DOB 1935/01/24, MRN [id number]  PCP:  Gladstone Lighter, MD  Cardiologist:   Kathlyn Sacramento, MD   Chief Complaint  Patient presents with   Other    OD 3-4 wk f/u no complaints today. Meds reviewed verbally with pt.      History of Present Illness: Ian Larsen is a 87 y.o. male who is here today for follow-up visit regarding permanent atrial fibrillation. He has known history of essential hypertension, spinal stenosis, prostate cancer and borderline diabetes.  He is status post cervical spine surgery in 2006.  He has no history of sleep apnea.  Renal artery duplex in 2019 showed no evidence of renal artery stenosis. Echocardiogram in December 2021 showed an EF of 55%, mild mitral regurgitation and mild to moderate tricuspid regurgitation. The patient underwent successful cardioversion in January 2022 with restoration of sinus rhythm .  He had recurrent atrial fibrillation earlier this year with increased fatigue.  He was seen by Dr. Quentin Ore.  It was felt that rate control is probably the best option given that he does not have significant symptoms during atrial fibrillation and he did not feel significant difference after cardioversion. The dose of carvedilol was increased to 18.75 mg twice daily but since then the patient reports worsening fatigue.  He feels very tired after he takes carvedilol and the effect continues for about 4 hours.  He had frequent urinations at night that improved after switching hydrochlorothiazide to spironolactone and adding Gemtesa by urology.  He has been doing reasonably well overall and denies chest pain or worsening dyspnea.  Past Medical History:  Diagnosis Date   Fusion of spine of cervical region 2005   Hypertension    a. 12/2017 Renal artery duplex: No renal artery stenosis. Nl Celiac and SMA artery findings.   Persistent atrial fibrillation ([place])    a. 05/2020 Echo: EF 55%, no  rwma, nl RV fxn, mild-mod TR, mild MR.   Polymyalgia rheumatica ([facility])    Prostate cancer ([place]) 1999   a. 1999 s/p radical prostatectomy and radical lymph node resections.    Renal cyst, right    a. 12/2017 Renal Duplex: incidental finding of 2.3 x 2.1 cm avascular cystic area in upper pole and 7.2 x 6.2 x 6.5 cm avascular cyst in upper-posterior portion of right kidney.   Valvular heart disease    a. 05/2020 Echo: EF 55%. Mild-mod TR, mild MR.    Past Surgical History:  Procedure Laterality Date   CARDIOVERSION N/A 07/26/2020   Procedure: CARDIOVERSION;  Surgeon: Wellington Hampshire, MD;  Location: [facility] ORS;  Service: Cardiovascular;  Laterality: N/A;   CERVICAL FUSION     HERNIA REPAIR     PROSTATECTOMY       Current Outpatient Medications  Medication Sig Dispense Refill   acetaminophen (TYLENOL) 500 MG tablet Take 500 mg by mouth 2 (two) times daily.     amLODipine (NORVASC) 5 MG tablet Take 1 tablet (5 mg total) by mouth daily. 90 tablet 3   apixaban (ELIQUIS) 5 MG TABS tablet Take 1 tablet (5 mg total) by mouth 2 (two) times daily. 180 tablet 1   Cholecalciferol (VITAMIN D) 2000 units tablet Take 2,000 Units by mouth daily.     Coenzyme Q10 (CO Q-10) 100 MG CAPS Take 100 mg by mouth daily.     DULoxetine (CYMBALTA) 20 MG capsule Take 20 mg by mouth daily.  GEMTESA 75 MG TABS Take 1 tablet by mouth daily.     Magnesium Citrate 100 MG TABS Take 100 mg by mouth daily.     Misc Natural Products (OSTEO BI-FLEX JOINT SHIELD PO) Take 1 capsule by mouth daily.     Multiple Vitamin (MULTIVITAMIN) capsule Take 1 capsule by mouth daily.     Omega-3 Fatty Acids (FISH OIL PO) Take 800 mg by mouth daily. Flaxseed oil/ 800 mg     spironolactone (ALDACTONE) 25 MG tablet TAKE 1 TABLET DAILY (STOP HYDROCHLOROTHIAZIDE) 90 tablet 3   tamsulosin (FLOMAX) 0.4 MG CAPS capsule Take 0.4 mg by mouth daily after supper.     TURMERIC CURCUMIN PO Take 500 mg by mouth daily.     valsartan (DIOVAN) 320 MG  tablet Take 320 mg by mouth daily.     carvedilol (COREG) 12.5 MG tablet Take 1 tablet (12.5 mg total) by mouth 2 (two) times daily with a meal. 90 tablet 2   No current facility-administered medications for this visit.    Allergies:   Fesoterodine, Fesoterodine fumarate er, Gabapentin, Hydrocodone-acetaminophen, Mirabegron, Neomycin, Tape, Tramadol hcl, Vicodin [hydrocodone-acetaminophen], Zoster vaccine live, Citric acid, Fluviral [influenza virus vaccine], Influenza vaccines, Sulfa antibiotics, and Vinegar [acetic acid]    Social History:  The patient  reports that he has quit smoking. His smoking use included cigarettes. He has never used smokeless tobacco. He reports that he does not drink alcohol and does not use drugs.   Family History:  The patient's family history includes Heart Problems in his mother; Hypertension in his brother and sister.    ROS:  Please see the history of present illness.   Otherwise, review of systems are positive for none.   All other systems are reviewed and negative.    PHYSICAL EXAM: VS:  BP 128/70 (BP Location: Left Arm, Patient Position: Sitting, Cuff Size: Normal)   Pulse 78   Ht '5\' 11"'$  (1.803 m)   Wt 222 lb 8 oz (100.9 kg)   SpO2 99%   BMI 31.03 kg/m  , BMI Body mass index is 31.03 kg/m. GEN: Well nourished, well developed, in no acute distress  HEENT: normal  Neck: no JVD, carotid bruits, or masses Cardiac: Irregularly irregular; no murmurs, rubs, or gallops, trace leg edema  Respiratory:  clear to auscultation bilaterally, normal work of breathing GI: soft, nontender, nondistended, + BS MS: no deformity or atrophy  Skin: warm and dry, no rash Neuro:  Strength and sensation are intact Psych: euthymic mood, full affect   EKG:  EKG is ordered today. The ekg ordered today demonstrates atrial fibrillation with left anterior fascicular block.  Ventricular rate is 78 bpm.  Recent Labs: 02/28/2021: BUN 19; Creatinine, Ser 0.95; Potassium 4.3;  Sodium 137    Lipid Panel No results found for: "CHOL", "TRIG", "HDL", "CHOLHDL", "VLDL", "LDLCALC", "LDLDIRECT"    Wt Readings from Last 3 Encounters:  01/05/22 222 lb 8 oz (100.9 kg)  09/07/21 222 lb (100.7 kg)  08/10/21 218 lb (98.9 kg)          01/11/2018   11:39 AM  PAD Screen  Previous PAD dx? No  Previous surgical procedure? No  Pain with walking? Yes  Subsides with rest? No  Feet/toe relief with dangling? No  Painful, non-healing ulcers? No  Extremities discolored? No      ASSESSMENT AND PLAN:  1.  Permanent atrial fibrillation: I think he has minimal symptoms related to atrial fibrillation and thus I recommend continuing rate control  strategy.  I do not think he benefits from ablation or antiarrhythmic medication.  He reports worsening fatigue and sleepiness after the dose of carvedilol was increased.  Thus, we will scale back to 12.5 mg twice daily.  Continue anticoagulation with Eliquis 5 mg twice daily.  2.  Essential hypertension: His blood pressure is well controlled on current medications.  Frequent urination at night improved after switching hydrochlorothiazide to spironolactone.   Disposition:   FU with me in 6 months  Signed,  Kathlyn Sacramento, MD  01/05/2022 9:11 AM    [place]

## 2022-01-19 ENCOUNTER — Telehealth: Payer: Self-pay | Admitting: Cardiovascular Disease

## 2022-01-19 DIAGNOSIS — I4819 Other persistent atrial fibrillation: Secondary | ICD-10-CM

## 2022-01-19 MED ORDER — APIXABAN 5 MG PO TABS
5.0000 mg | ORAL_TABLET | Freq: Two times a day (BID) | ORAL | 1 refills | Status: DC
Start: 2022-01-19 — End: 2022-07-31

## 2022-01-19 NOTE — Telephone Encounter (Signed)
Prescription refill request for Eliquis received. Indication: Afib  Last office visit: 01/05/22 [place] Anon)  Scr: 1.1 (09/29/21) Age: 87 Weight: 100.9kg  Appropriate dose and refill sent to requested pharmacy.

## 2022-01-19 NOTE — Telephone Encounter (Signed)
*  STAT* If patient is at the pharmacy, call can be transferred to refill team.   1. Which medications need to be refilled? (please list name of each medication and dose if known)  apixaban (ELIQUIS) 5 MG TABS tablet  2. Which pharmacy/location (including street and city if local pharmacy) is medication to be sent to?  [facility] - [place], [place] - [address]  3. Do they need a 30 day or 90 day supply? 90  

## 2022-01-19 NOTE — Telephone Encounter (Signed)
Refill Request.  

## 2022-01-23 ENCOUNTER — Encounter: Payer: Self-pay | Admitting: Dermatology

## 2022-01-23 ENCOUNTER — Ambulatory Visit: Payer: Medicare Other | Admitting: Dermatology

## 2022-01-23 DIAGNOSIS — L820 Inflamed seborrheic keratosis: Secondary | ICD-10-CM | POA: Diagnosis not present

## 2022-01-23 DIAGNOSIS — L720 Epidermal cyst: Secondary | ICD-10-CM

## 2022-01-23 DIAGNOSIS — L821 Other seborrheic keratosis: Secondary | ICD-10-CM | POA: Diagnosis not present

## 2022-01-23 NOTE — Progress Notes (Signed)
   Follow-Up Visit   Subjective  Ian Larsen is a 87 y.o. male who presents for the following: Cyst (On the back - hx of inflammation, some pain, hx of rupture and drainage. He applied antibiotic ointment on it and covered it with a bandage. Then a rash occurred around area. Inflammation and rash have since resolved but patient would like checked today ). The patient has spots, moles and lesions to be evaluated, some may be new or changing and the patient has concerns that these could be cancer.  The following portions of the chart were reviewed this encounter and updated as appropriate:   Tobacco  Allergies  Meds  Problems  Med Hx  Surg Hx  Fam Hx     Review of Systems:  No other skin or systemic complaints except as noted in HPI or Assessment and Plan.  Objective  Well appearing patient in no apparent distress; mood and affect are within normal limits.  A focused examination was performed including the face and trunk. Relevant physical exam findings are noted in the Assessment and Plan.  R low back 1.5 cm firm SQ nodule.  R forearm x 1, L post auricular x 2, R post auricular x 2 (5) Erythematous stuck-on, waxy papule or plaque   Assessment & Plan  Epidermal inclusion cyst R low back  Hx of rupture and drainage - but persistent cyst  Benign-appearing. Exam most consistent with an epidermal inclusion cyst. Discussed that a cyst is a benign growth that can grow over time and sometimes get irritated or inflamed. Recommend observation if it is not bothersome. Discussed option of surgical excision to remove it if it is growing, symptomatic, or other changes noted. Please call for new or changing lesions so they can be evaluated.  Inflamed seborrheic keratosis (5) R forearm x 1, L post auricular x 2, R post auricular x 2 Symptomatic, irritating, patient would like treated.  Destruction of lesion - R forearm x 1, L post auricular x 2, R post auricular x 2 Complexity: simple    Destruction method: cryotherapy   Informed consent: discussed and consent obtained   Timeout:  patient name, date of birth, surgical site, and procedure verified Lesion destroyed using liquid nitrogen: Yes   Region frozen until ice ball extended beyond lesion: Yes   Outcome: patient tolerated procedure well with no complications   Post-procedure details: wound care instructions given    Seborrheic Keratoses - Stuck-on, waxy, tan-brown papules and/or plaques  - Benign-appearing - Discussed benign etiology and prognosis. - Observe - Call for any changes  Return for surgery - cyst excision R low back.  Luther Redo, CMA, am acting as scribe for Sarina Ser, MD . Documentation: I have reviewed the above documentation for accuracy and completeness, and I agree with the above.  Sarina Ser, MD

## 2022-01-23 NOTE — Patient Instructions (Signed)
 Pre-Operative Instructions  You are scheduled for a surgical procedure at [facility]. We recommend you read the following instructions. If you have any questions or concerns, please call the office at [phone].  Shower and wash the entire body with soap and water the day of your surgery paying special attention to cleansing at and around the planned surgery site.  Avoid aspirin or aspirin containing products at least fourteen (14) days prior to your surgical procedure and for at least one week (7 Days) after your surgical procedure. If you take aspirin on a regular basis for heart disease or history of stroke or for any other reason, we may recommend you continue taking aspirin but please notify us if you take this on a regular basis. Aspirin can cause more bleeding to occur during surgery as well as prolonged bleeding and bruising after surgery.   Avoid other nonsteroidal pain medications at least one week prior to surgery and at least one week prior to your surgery. These include medications such as Ibuprofen (Motrin, Advil and Nuprin), Naprosyn, Voltaren, Relafen, etc. If medications are used for therapeutic reasons, please inform us as they can cause increased bleeding or prolonged bleeding during and bruising after surgical procedures.   Please advise us if you are taking any "blood thinner" medications such as Coumadin or Dipyridamole or Plavix or similar medications. These cause increased bleeding and prolonged bleeding during procedures and bruising after surgical procedures. We may have to consider discontinuing these medications briefly prior to and shortly after your surgery if safe to do so.   Please inform us of all medications you are currently taking. All medications that are taken regularly should be taken the day of surgery as you always do. Nevertheless, we need to be informed of what medications you are taking prior to surgery to know whether they will affect the  procedure or cause any complications.   Please inform us of any medication allergies. Also inform us of whether you have allergies to Latex or rubber products or whether you have had any adverse reaction to Lidocaine or Epinephrine.  Please inform us of any prosthetic or artificial body parts such as artificial heart valve, joint replacements, etc., or similar condition that might require preoperative antibiotics.   We recommend avoidance of alcohol at least two weeks prior to surgery and continued avoidance for at least two weeks after surgery.   We recommend discontinuation of tobacco smoking at least two weeks prior to surgery and continued abstinence for at least two weeks after surgery.  Do not plan strenuous exercise, strenuous work or strenuous lifting for approximately four weeks after your surgery.   We request if you are unable to make your scheduled surgical appointment, please call us at least a week in advance or as soon as you are aware of a problem so that we can cancel or reschedule the appointment.   You MAY TAKE TYLENOL (acetaminophen) for pain as it is not a blood thinner.   PLEASE PLAN TO BE IN TOWN FOR TWO WEEKS FOLLOWING SURGERY, THIS IS IMPORTANT SO YOU CAN BE CHECKED FOR DRESSING CHANGES, SUTURE REMOVAL AND TO MONITOR FOR POSSIBLE COMPLICATIONS.    Due to recent changes in healthcare laws, you may see results of your pathology and/or laboratory studies on MyChart before the doctors have had a chance to review them. We understand that in some cases there may be results that are confusing or concerning to you. Please understand that not all results are   received at the same time and often the doctors may need to interpret multiple results in order to provide you with the best plan of care or course of treatment. Therefore, we ask that you please give us 2 business days to thoroughly review all your results before contacting the office for clarification. Should we see a  critical lab result, you will be contacted sooner.   If You Need Anything After Your Visit  If you have any questions or concerns for your doctor, please call our main line at [phone] and press option 4 to reach your doctor's medical assistant. If no one answers, please leave a voicemail as directed and we will return your call as soon as possible. Messages left after 4 pm will be answered the following business day.   You may also send us a message via MyChart. We typically respond to MyChart messages within 1-2 business days.  For prescription refills, please ask your pharmacy to contact our office. Our fax number is [phone].  If you have an urgent issue when the clinic is closed that cannot wait until the next business day, you can page your doctor at the number below.    Please note that while we do our best to be available for urgent issues outside of office hours, we are not available 24/7.   If you have an urgent issue and are unable to reach us, you may choose to seek medical care at your doctor's office, retail clinic, urgent care center, or emergency room.  If you have a medical emergency, please immediately call 911 or go to the emergency department.  Pager Numbers  - Dr. Kowalski: [phone]  - Dr. Moye: [phone]  - Dr. Stewart: [phone]  In the event of inclement weather, please call our main line at [phone] for an update on the status of any delays or closures.  Dermatology Medication Tips: Please keep the boxes that topical medications come in in order to help keep track of the instructions about where and how to use these. Pharmacies typically print the medication instructions only on the boxes and not directly on the medication tubes.   If your medication is too expensive, please contact our office at [phone] option 4 or send us a message through MyChart.   We are unable to tell what your co-pay for medications will be in advance as  this is different depending on your insurance coverage. However, we may be able to find a substitute medication at lower cost or fill out paperwork to get insurance to cover a needed medication.   If a prior authorization is required to get your medication covered by your insurance company, please allow us 1-2 business days to complete this process.  Drug prices often vary depending on where the prescription is filled and some pharmacies may offer cheaper prices.  The website www.goodrx.com contains coupons for medications through different pharmacies. The prices here do not account for what the cost may be with help from insurance (it may be cheaper with your insurance), but the website can give you the price if you did not use any insurance.  - You can print the associated coupon and take it with your prescription to the pharmacy.  - You may also stop by our office during regular business hours and pick up a GoodRx coupon card.  - If you need your prescription sent electronically to a different pharmacy, notify our office through [place] MyChart or by phone at [phone] option 4.       Si Usted Necesita Algo Despus de Su Visita  Tambin puede enviarnos un mensaje a travs de MyChart. Por lo general respondemos a los mensajes de MyChart en el transcurso de 1 a 2 das hbiles.  Para renovar recetas, por favor pida a su farmacia que se ponga en contacto con nuestra oficina. Nuestro nmero de fax es el [phone].  Si tiene un asunto urgente cuando la clnica est cerrada y que no puede esperar hasta el siguiente da hbil, puede llamar/localizar a su doctor(a) al nmero que aparece a continuacin.   Por favor, tenga en cuenta que aunque hacemos todo lo posible para estar disponibles para asuntos urgentes fuera del horario de oficina, no estamos disponibles las 24 horas del da, los 7 das de la semana.   Si tiene un problema urgente y no puede comunicarse con nosotros, puede optar por  buscar atencin mdica  en el consultorio de su doctor(a), en una clnica privada, en un centro de atencin urgente o en una sala de emergencias.  Si tiene una emergencia mdica, por favor llame inmediatamente al 911 o vaya a la sala de emergencias.  Nmeros de bper  - Dr. Kowalski: [phone]  - Dra. Moye: [phone]  - Dra. Stewart: [phone]  En caso de inclemencias del tiempo, por favor llame a nuestra lnea principal al [phone] para una actualizacin sobre el estado de cualquier retraso o cierre.  Consejos para la medicacin en dermatologa: Por favor, guarde las cajas en las que vienen los medicamentos de uso tpico para ayudarle a seguir las instrucciones sobre dnde y cmo usarlos. Las farmacias generalmente imprimen las instrucciones del medicamento slo en las cajas y no directamente en los tubos del medicamento.   Si su medicamento es muy caro, por favor, pngase en contacto con nuestra oficina llamando al [phone] y presione la opcin 4 o envenos un mensaje a travs de MyChart.   No podemos decirle cul ser su copago por los medicamentos por adelantado ya que esto es diferente dependiendo de la cobertura de su seguro. Sin embargo, es posible que podamos encontrar un medicamento sustituto a menor costo o llenar un formulario para que el seguro cubra el medicamento que se considera necesario.   Si se requiere una autorizacin previa para que su compaa de seguros cubra su medicamento, por favor permtanos de 1 a 2 das hbiles para completar este proceso.  Los precios de los medicamentos varan con frecuencia dependiendo del lugar de dnde se surte la receta y alguna farmacias pueden ofrecer precios ms baratos.  El sitio web www.goodrx.com tiene cupones para medicamentos de diferentes farmacias. Los precios aqu no tienen en cuenta lo que podra costar con la ayuda del seguro (puede ser ms barato con su seguro), pero el sitio web puede darle el precio si no  utiliz ningn seguro.  - Puede imprimir el cupn correspondiente y llevarlo con su receta a la farmacia.  - Tambin puede pasar por nuestra oficina durante el horario de atencin regular y recoger una tarjeta de cupones de GoodRx.  - Si necesita que su receta se enve electrnicamente a una farmacia diferente, informe a nuestra oficina a travs de MyChart de Ian Larsen o por telfono llamando al [phone] y presione la opcin 4.  

## 2022-01-31 ENCOUNTER — Telehealth: Payer: Self-pay

## 2022-01-31 ENCOUNTER — Ambulatory Visit: Payer: Medicare Other | Admitting: Dermatology

## 2022-01-31 ENCOUNTER — Encounter: Payer: Self-pay | Admitting: Dermatology

## 2022-01-31 DIAGNOSIS — L720 Epidermal cyst: Secondary | ICD-10-CM | POA: Diagnosis not present

## 2022-01-31 DIAGNOSIS — D485 Neoplasm of uncertain behavior of skin: Secondary | ICD-10-CM

## 2022-01-31 MED ORDER — MUPIROCIN 2 % EX OINT: TOPICAL_OINTMENT | CUTANEOUS | 0 refills | Status: DC

## 2022-01-31 NOTE — Progress Notes (Signed)
   Follow-Up Visit   Subjective  Ian Larsen is a 87 y.o. male who presents for the following: Cyst (Vs other of right low back - Excise today).  The following portions of the chart were reviewed this encounter and updated as appropriate:   Tobacco  Allergies  Meds  Problems  Med Hx  Surg Hx  Fam Hx     Review of Systems:  No other skin or systemic complaints except as noted in HPI or Assessment and Plan.  Objective  Well appearing patient in no apparent distress; mood and affect are within normal limits.  A focused examination was performed including the face and trunk. Relevant physical exam findings are noted in the Assessment and Plan.  Right Lower Back Cystic papule 3.2 cm    Assessment & Plan  Neoplasm of uncertain behavior of skin Right Lower Back  Skin excision  Lesion length (cm):  3.2 Lesion width (cm):  3.2 Margin per side (cm):  0 Total excision diameter (cm):  3.2 Informed consent: discussed and consent obtained   Timeout: patient name, date of birth, surgical site, and procedure verified   Procedure prep:  Patient was prepped and draped in usual sterile fashion Prep type:  Isopropyl alcohol and povidone-iodine Anesthesia: the lesion was anesthetized in a standard fashion   Anesthetic:  1% lidocaine w/ epinephrine 1-100,000 buffered w/ 8.4% NaHCO3 Instrument used: #15 blade   Hemostasis achieved with: pressure   Hemostasis achieved with comment:  Electrocautery Outcome: patient tolerated procedure well with no complications   Post-procedure details: sterile dressing applied and wound care instructions given   Dressing type: bandage and pressure dressing (mupirocin)    Skin repair Complexity:  Complex Final length (cm):  5 Reason for type of repair: reduce tension to allow closure, reduce the risk of dehiscence, infection, and necrosis, reduce subcutaneous dead space and avoid a hematoma, allow closure of the large defect, preserve normal anatomy,  preserve normal anatomical and functional relationships and enhance both functionality and cosmetic results   Undermining: area extensively undermined   Undermining comment:  Undermining defect 3.2 cm Subcutaneous layers (deep stitches):  Suture size:  2-0 Suture type: Vicryl (polyglactin 910)   Subcutaneous suture technique: inverted dermal. Fine/surface layer approximation (top stitches):  Suture size:  2-0 Suture type: nylon   Stitches: simple running   Suture removal (days):  7 Hemostasis achieved with: suture and pressure Outcome: patient tolerated procedure well with no complications   Post-procedure details: sterile dressing applied and wound care instructions given   Dressing type: bandage and pressure dressing (mupirocin)    mupirocin ointment (BACTROBAN) 2 % Apply to excision site QD and cover with bandage.  Specimen 1 - Surgical pathology Differential Diagnosis: Cyst vs other Check Margins: No  Start Mupirocin 2% ointment to aa QD and cover with bandage.   Return in about 1 week (around 02/07/2022) for suture removal.  I, Rudell Cobb, CMA, am acting as scribe for Sarina Ser, MD . Documentation: I have reviewed the above documentation for accuracy and completeness, and I agree with the above.  Sarina Ser, MD

## 2022-01-31 NOTE — Telephone Encounter (Signed)
Left message for patient regarding surgery/hd  

## 2022-01-31 NOTE — Patient Instructions (Signed)

## 2022-02-07 ENCOUNTER — Ambulatory Visit (INDEPENDENT_AMBULATORY_CARE_PROVIDER_SITE_OTHER): Payer: Medicare Other | Admitting: Dermatology

## 2022-02-07 DIAGNOSIS — Z4802 Encounter for removal of sutures: Secondary | ICD-10-CM

## 2022-02-07 DIAGNOSIS — L720 Epidermal cyst: Secondary | ICD-10-CM

## 2022-02-07 NOTE — Patient Instructions (Signed)
Due to recent changes in healthcare laws, you may see results of your pathology and/or laboratory studies on MyChart before the doctors have had a chance to review them. We understand that in some cases there may be results that are confusing or concerning to you. Please understand that not all results are received at the same time and often the doctors may need to interpret multiple results in order to provide you with the best plan of care or course of treatment. Therefore, we ask that you please give us 2 business days to thoroughly review all your results before contacting the office for clarification. Should we see a critical lab result, you will be contacted sooner.   If You Need Anything After Your Visit  If you have any questions or concerns for your doctor, please call our main line at [phone] and press option 4 to reach your doctor's medical assistant. If no one answers, please leave a voicemail as directed and we will return your call as soon as possible. Messages left after 4 pm will be answered the following business day.   You may also send us a message via MyChart. We typically respond to MyChart messages within 1-2 business days.  For prescription refills, please ask your pharmacy to contact our office. Our fax number is [phone].  If you have an urgent issue when the clinic is closed that cannot wait until the next business day, you can page your doctor at the number below.    Please note that while we do our best to be available for urgent issues outside of office hours, we are not available 24/7.   If you have an urgent issue and are unable to reach us, you may choose to seek medical care at your doctor's office, retail clinic, urgent care center, or emergency room.  If you have a medical emergency, please immediately call 911 or go to the emergency department.  Pager Numbers  - Dr. Kowalski: [phone]  - Dr. Moye: [phone]  - Dr. Stewart:  [phone]  In the event of inclement weather, please call our main line at [phone] for an update on the status of any delays or closures.  Dermatology Medication Tips: Please keep the boxes that topical medications come in in order to help keep track of the instructions about where and how to use these. Pharmacies typically print the medication instructions only on the boxes and not directly on the medication tubes.   If your medication is too expensive, please contact our office at [phone] option 4 or send us a message through MyChart.   We are unable to tell what your co-pay for medications will be in advance as this is different depending on your insurance coverage. However, we may be able to find a substitute medication at lower cost or fill out paperwork to get insurance to cover a needed medication.   If a prior authorization is required to get your medication covered by your insurance company, please allow us 1-2 business days to complete this process.  Drug prices often vary depending on where the prescription is filled and some pharmacies may offer cheaper prices.  The website www.goodrx.com contains coupons for medications through different pharmacies. The prices here do not account for what the cost may be with help from insurance (it may be cheaper with your insurance), but the website can give you the price if you did not use any insurance.  - You can print the associated coupon and take it with   your prescription to the pharmacy.  - You may also stop by our office during regular business hours and pick up a GoodRx coupon card.  - If you need your prescription sent electronically to a different pharmacy, notify our office through [place] MyChart or by phone at [phone] option 4.     Si Usted Necesita Algo Despus de Su Visita  Tambin puede enviarnos un mensaje a travs de MyChart. Por lo general respondemos a los mensajes de MyChart en el transcurso de 1 a 2  das hbiles.  Para renovar recetas, por favor pida a su farmacia que se ponga en contacto con nuestra oficina. Nuestro nmero de fax es el [phone].  Si tiene un asunto urgente cuando la clnica est cerrada y que no puede esperar hasta el siguiente da hbil, puede llamar/localizar a su doctor(a) al nmero que aparece a continuacin.   Por favor, tenga en cuenta que aunque hacemos todo lo posible para estar disponibles para asuntos urgentes fuera del horario de oficina, no estamos disponibles las 24 horas del da, los 7 das de la semana.   Si tiene un problema urgente y no puede comunicarse con nosotros, puede optar por buscar atencin mdica  en el consultorio de su doctor(a), en una clnica privada, en un centro de atencin urgente o en una sala de emergencias.  Si tiene una emergencia mdica, por favor llame inmediatamente al 911 o vaya a la sala de emergencias.  Nmeros de bper  - Dr. Kowalski: [phone]  - Dra. Moye: [phone]  - Dra. Stewart: [phone]  En caso de inclemencias del tiempo, por favor llame a nuestra lnea principal al [phone] para una actualizacin sobre el estado de cualquier retraso o cierre.  Consejos para la medicacin en dermatologa: Por favor, guarde las cajas en las que vienen los medicamentos de uso tpico para ayudarle a seguir las instrucciones sobre dnde y cmo usarlos. Las farmacias generalmente imprimen las instrucciones del medicamento slo en las cajas y no directamente en los tubos del medicamento.   Si su medicamento es muy caro, por favor, pngase en contacto con nuestra oficina llamando al [phone] y presione la opcin 4 o envenos un mensaje a travs de MyChart.   No podemos decirle cul ser su copago por los medicamentos por adelantado ya que esto es diferente dependiendo de la cobertura de su seguro. Sin embargo, es posible que podamos encontrar un medicamento sustituto a menor costo o llenar un formulario para que el  seguro cubra el medicamento que se considera necesario.   Si se requiere una autorizacin previa para que su compaa de seguros cubra su medicamento, por favor permtanos de 1 a 2 das hbiles para completar este proceso.  Los precios de los medicamentos varan con frecuencia dependiendo del lugar de dnde se surte la receta y alguna farmacias pueden ofrecer precios ms baratos.  El sitio web www.goodrx.com tiene cupones para medicamentos de diferentes farmacias. Los precios aqu no tienen en cuenta lo que podra costar con la ayuda del seguro (puede ser ms barato con su seguro), pero el sitio web puede darle el precio si no utiliz ningn seguro.  - Puede imprimir el cupn correspondiente y llevarlo con su receta a la farmacia.  - Tambin puede pasar por nuestra oficina durante el horario de atencin regular y recoger una tarjeta de cupones de GoodRx.  - Si necesita que su receta se enve electrnicamente a una farmacia diferente, informe a nuestra oficina a travs de MyChart de [facility]   o por telfono llamando al [phone] y presione la opcin 4.  

## 2022-02-07 NOTE — Progress Notes (Signed)
   Follow-Up Visit   Subjective  Ian Larsen is a 87 y.o. male who presents for the following: Post op/suture removal (R low back - pathology proven benign cyst).  The following portions of the chart were reviewed this encounter and updated as appropriate:   Tobacco  Allergies  Meds  Problems  Med Hx  Surg Hx  Fam Hx     Review of Systems:  No other skin or systemic complaints except as noted in HPI or Assessment and Plan.  Objective  Well appearing patient in no apparent distress; mood and affect are within normal limits.  A focused examination was performed including the face and back. Relevant physical exam findings are noted in the Assessment and Plan.  Right Lower Back Healing excision site.   Assessment & Plan  Epidermal inclusion cyst Right Lower Back  Encounter for Removal of Sutures - Incision site at the R low back is clean, dry and intact - Wound cleansed, sutures removed, wound cleansed and steri strips applied.  - Discussed pathology results showing a benign cyst.  - Patient advised to keep steri-strips dry until they fall off. - Scars remodel for a full year. - Once steri-strips fall off, patient can apply over-the-counter silicone scar cream each night to help with scar remodeling if desired. - Patient advised to call with any concerns or if they notice any new or changing lesions.  Return if symptoms worsen or fail to improve.  Luther Redo, CMA, am acting as scribe for Sarina Ser, MD . Documentation: I have reviewed the above documentation for accuracy and completeness, and I agree with the above.  Sarina Ser, MD

## 2022-02-13 ENCOUNTER — Encounter: Payer: Self-pay | Admitting: Dermatology

## 2022-07-17 ENCOUNTER — Encounter: Payer: Self-pay | Admitting: Urology

## 2022-07-17 ENCOUNTER — Ambulatory Visit: Payer: Medicare Other | Admitting: Urology

## 2022-07-17 VITALS — BP 130/80 | HR 74 | Ht 71.0 in | Wt 220.0 lb

## 2022-07-17 DIAGNOSIS — Z8546 Personal history of malignant neoplasm of prostate: Secondary | ICD-10-CM

## 2022-07-17 DIAGNOSIS — N3281 Overactive bladder: Secondary | ICD-10-CM

## 2022-07-17 NOTE — Progress Notes (Signed)
07/17/2022 2:17 PM   [name] [birth year] [id number]  Referring provider: Gladstone Lighter, MD [place],  [place] [address]  Chief Complaint  Patient presents with   Other    HPI: Ian Larsen is a 88 y.o. male who is a former patient of Dr. Yves Dill who presents to establish urologic care.  Previously followed for overactive bladder and was on Gemtesa 75 mg and tamsulosin 0.4 mg daily History of prostate cancer status post radical prostatectomy 1999 Last saw Dr. Yves Dill April 2023 Had cystoscopy at the time of his OAB diagnosis and had no evidence of bladder neck contracture or bladder mucosal abnormalities He desires to continue annual PSA testing post radical prostatectomy He has developed lower extremity edema and was placed on Lasix by his PCP and does note increased urinary frequency secondary to the diuretic   PMH: Past Medical History:  Diagnosis Date   Fusion of spine of cervical region 2005   Hypertension    a. 12/2017 Renal artery duplex: No renal artery stenosis. Nl Celiac and SMA artery findings.   Persistent atrial fibrillation ([facility])    a. 05/2020 Echo: EF 55%, no rwma, nl RV fxn, mild-mod TR, mild MR.   Polymyalgia rheumatica ([facility])    Prostate cancer ([place]) 1999   a. 1999 s/p radical prostatectomy and radical lymph node resections.    Renal cyst, right    a. 12/2017 Renal Duplex: incidental finding of 2.3 x 2.1 cm avascular cystic area in upper pole and 7.2 x 6.2 x 6.5 cm avascular cyst in upper-posterior portion of right kidney.   Valvular heart disease    a. 05/2020 Echo: EF 55%. Mild-mod TR, mild MR.    Surgical History: Past Surgical History:  Procedure Laterality Date   CARDIOVERSION N/A 07/26/2020   Procedure: CARDIOVERSION;  Surgeon: Wellington Hampshire, MD;  Location: [facility] ORS;  Service: Cardiovascular;  Laterality: N/A;   CERVICAL FUSION     HERNIA REPAIR     PROSTATECTOMY      Home Medications:  Allergies as of 07/17/2022        Reactions   Fesoterodine Other (See Comments)   Elevate blood pressure   Fesoterodine Fumarate Er Other (See Comments)   Elevate blood pressure   Gabapentin Itching   Other reaction(s): itching   Hydrocodone-acetaminophen    Other reaction(s): dizzy   Mirabegron Other (See Comments)   Elevate blood pressure   Neomycin Other (See Comments)   Other reaction(s): Other (See Comments), rash rash   Tape Dermatitis   Please use paper tape   Tramadol Hcl Nausea Only   Dizzy   Vicodin [hydrocodone-acetaminophen] Nausea And Vomiting   Patient states this made him dizzy and felt horrible.   Zoster Vaccine Live Other (See Comments)   Patient does not wish to take because of his neomycin allergy Other reaction(s): patient does not wish to take because of his neomycin allergy   Citric Acid Rash   Fluviral [influenza Virus Vaccine] Rash   Elevated temperature   Influenza Vaccines Rash   Other reaction(s): hyperthermia    Sulfa Antibiotics Rash   Vinegar [acetic Acid] Rash        Medication List        Accurate as of July 17, 2022  2:17 PM. If you have any questions, ask your nurse or doctor.          STOP taking these medications    mupirocin ointment 2 % Commonly known as: BACTROBAN Stopped by:  Abbie Sons, MD       TAKE these medications    acetaminophen 500 MG tablet Commonly known as: TYLENOL Take 500 mg by mouth 2 (two) times daily.   amLODipine 5 MG tablet Commonly known as: NORVASC Take 1 tablet (5 mg total) by mouth daily.   apixaban 5 MG Tabs tablet Commonly known as: ELIQUIS Take 1 tablet (5 mg total) by mouth 2 (two) times daily.   carvedilol 12.5 MG tablet Commonly known as: COREG Take 1 tablet (12.5 mg total) by mouth 2 (two) times daily with a meal.   Co Q-10 100 MG Caps Take 100 mg by mouth daily.   DULoxetine 20 MG capsule Commonly known as: CYMBALTA Take 20 mg by mouth daily.   FISH OIL PO Take 800 mg by mouth daily.  Flaxseed oil/ 800 mg   Gemtesa 75 MG Tabs Generic drug: Vibegron Take 1 tablet by mouth daily.   Magnesium Citrate 100 MG Tabs Take 100 mg by mouth daily.   multivitamin capsule Take 1 capsule by mouth daily.   OSTEO BI-FLEX JOINT SHIELD PO Take 1 capsule by mouth daily.   spironolactone 25 MG tablet Commonly known as: ALDACTONE TAKE 1 TABLET DAILY (STOP HYDROCHLOROTHIAZIDE)   tamsulosin 0.4 MG Caps capsule Commonly known as: FLOMAX Take 0.4 mg by mouth daily after supper.   TURMERIC CURCUMIN PO Take 500 mg by mouth daily.   valsartan 320 MG tablet Commonly known as: DIOVAN Take 320 mg by mouth daily.   Vitamin D 50 MCG (2000 UT) tablet Take 2,000 Units by mouth daily.        Allergies:  Allergies  Allergen Reactions   Fesoterodine Other (See Comments)    Elevate blood pressure   Fesoterodine Fumarate Er Other (See Comments)    Elevate blood pressure   Gabapentin Itching    Other reaction(s): itching   Hydrocodone-Acetaminophen     Other reaction(s): dizzy   Mirabegron Other (See Comments)    Elevate blood pressure   Neomycin Other (See Comments)    Other reaction(s): Other (See Comments), rash rash    Tape Dermatitis    Please use paper tape   Tramadol Hcl Nausea Only    Dizzy   Vicodin [Hydrocodone-Acetaminophen] Nausea And Vomiting    Patient states this made him dizzy and felt horrible.   Zoster Vaccine Live Other (See Comments)    Patient does not wish to take because of his neomycin allergy Other reaction(s): patient does not wish to take because of his neomycin allergy   Citric Acid Rash   Fluviral [Influenza Virus Vaccine] Rash    Elevated temperature   Influenza Vaccines Rash    Other reaction(s): hyperthermia    Sulfa Antibiotics Rash   Vinegar [Acetic Acid] Rash    Family History: Family History  Problem Relation Age of Onset   Heart Problems Mother    Hypertension Sister    Hypertension Brother     Social History:  reports  that he has quit smoking. His smoking use included cigarettes. He has never used smokeless tobacco. He reports that he does not drink alcohol and does not use drugs.   Physical Exam: BP 130/80   Pulse 74   Ht '5\' 11"'$  (1.803 m)   Wt 220 lb (99.8 kg)   BMI 30.68 kg/m   Constitutional:  Alert and oriented, No acute distress. HEENT: [place] AT Respiratory: Normal respiratory effort, no increased work of breathing. Psychiatric: Normal mood and affect.  Assessment & Plan:    1.  History of prostate cancer PSA has remained undetectable since his surgery and was ordered today Follow-up 1 year  2.  Overactive bladder Currently doing well on [place], MD  [facility] [address] [place],  [address] [phone]

## 2022-07-18 LAB — PSA: Prostate Specific Ag, Serum: <0.1 ng/mL (ref 0.0–4.0)

## 2022-07-19 ENCOUNTER — Encounter: Payer: Self-pay | Admitting: *Deleted

## 2022-07-30 ENCOUNTER — Other Ambulatory Visit: Payer: Self-pay | Admitting: Cardiovascular Disease

## 2022-07-30 DIAGNOSIS — I4819 Other persistent atrial fibrillation: Secondary | ICD-10-CM

## 2022-07-31 NOTE — Telephone Encounter (Signed)
Prescription refill request for Eliquis received. Indication:afib Last office visit:7/23 Scr:1.1  9/23 Age: 88 Weight:99.8  kg  Prescription refilled

## 2022-08-31 ENCOUNTER — Ambulatory Visit: Payer: Medicare Other | Attending: Cardiovascular Disease | Admitting: Cardiovascular Disease

## 2022-08-31 ENCOUNTER — Encounter: Payer: Self-pay | Admitting: Cardiovascular Disease

## 2022-08-31 VITALS — BP 152/92 | HR 102 | Ht 71.0 in | Wt 225.6 lb

## 2022-08-31 DIAGNOSIS — I4821 Permanent atrial fibrillation: Secondary | ICD-10-CM | POA: Diagnosis not present

## 2022-08-31 DIAGNOSIS — I872 Venous insufficiency (chronic) (peripheral): Secondary | ICD-10-CM

## 2022-08-31 DIAGNOSIS — I10 Essential (primary) hypertension: Secondary | ICD-10-CM | POA: Diagnosis not present

## 2022-08-31 MED ORDER — METOPROLOL TARTRATE 25 MG PO TABS: 25.0000 mg | ORAL_TABLET | Freq: Two times a day (BID) | ORAL | 1 refills | Status: DC

## 2022-08-31 NOTE — Patient Instructions (Signed)
Medication Instructions:  STOP the Carvedilol  START Metoprolol Tartrate 25 mg twice daily  *If you need a refill on your cardiac medications before your next appointment, please call your pharmacy*   Lab Work: None ordered If you have labs (blood work) drawn today and your tests are completely normal, you will receive your results only by: [place] (if you have MyChart) OR A paper copy in the mail If you have any lab test that is abnormal or we need to change your treatment, we will call you to review the results.   Testing/Procedures: None ordered   Follow-Up: At [facility], you and your health needs are our priority.  As part of our continuing mission to provide you with exceptional heart care, we have created designated Provider Care Teams.  These Care Teams include your primary Cardiologist (physician) and Advanced Practice Providers (APPs -  Physician Assistants and Nurse Practitioners) who all work together to provide you with the care you need, when you need it.  We recommend signing up for the patient portal called "MyChart".  Sign up information is provided on this After Visit Summary.  MyChart is used to connect with patients for Virtual Visits (Telemedicine).  Patients are able to view lab/test results, encounter notes, upcoming appointments, etc.  Non-urgent messages can be sent to your provider as well.   To learn more about what you can do with MyChart, go to [email or web address].    Your next appointment:   6 month(s)  Provider:   You may see Kathlyn Sacramento, MD or one of the following Advanced Practice Providers on your designated Care Team:   Murray Hodgkins, NP Christell Faith, PA-C Cadence Kathlen Mody, PA-C Gerrie Nordmann, NP

## 2022-08-31 NOTE — Progress Notes (Signed)
Cardiology Office Note   Date:  08/31/2022   ID:  Ian Larsen, DOB 1935-03-11, MRN [id number]  PCP:  Gladstone Lighter, MD  Cardiologist:   Kathlyn Sacramento, MD   Chief Complaint  Patient presents with   Follow-up    6 month f/u, med side effects, swelling in legs slightly worse      History of Present Illness: Ian Larsen is a 88 y.o. male who is here today for follow-up visit regarding permanent atrial fibrillation. He has known history of essential hypertension, spinal stenosis, prostate cancer and borderline diabetes.  He is status post cervical spine surgery in 2006.  He has no history of sleep apnea.  Renal artery duplex in 2019 showed no evidence of renal artery stenosis. Echocardiogram in December 2021 showed an EF of 55%, mild mitral regurgitation and mild to moderate tricuspid regurgitation. The patient underwent successful cardioversion in January 2022 with restoration of sinus rhythm .  He had recurrent atrial fibrillation earlier this year with increased fatigue.  He was seen by Dr. Quentin Ore.  It was felt that rate control is probably the best option given that he does not have significant symptoms during atrial fibrillation and he did not feel significant difference after cardioversion.  The dose of carvedilol was decreased to 12.5 mg twice daily previously due to increased fatigue but even with that, he continues to complain of feeling very tired and sleepy after he takes a dose of carvedilol.  He denies chest pain or worsening dyspnea.  He does have lower extremity edema likely due to chronic venous insufficiency.  Past Medical History:  Diagnosis Date   Fusion of spine of cervical region 2005   Hypertension    a. 12/2017 Renal artery duplex: No renal artery stenosis. Nl Celiac and SMA artery findings.   Persistent atrial fibrillation ([place])    a. 05/2020 Echo: EF 55%, no rwma, nl RV fxn, mild-mod TR, mild MR.   Polymyalgia rheumatica (HCC)    Prostate cancer  ([place]) 1999   a. 1999 s/p radical prostatectomy and radical lymph node resections.    Renal cyst, right    a. 12/2017 Renal Duplex: incidental finding of 2.3 x 2.1 cm avascular cystic area in upper pole and 7.2 x 6.2 x 6.5 cm avascular cyst in upper-posterior portion of right kidney.   Valvular heart disease    a. 05/2020 Echo: EF 55%. Mild-mod TR, mild MR.    Past Surgical History:  Procedure Laterality Date   CARDIOVERSION N/A 07/26/2020   Procedure: CARDIOVERSION;  Surgeon: Wellington Hampshire, MD;  Location: [facility] ORS;  Service: Cardiovascular;  Laterality: N/A;   CERVICAL FUSION     HERNIA REPAIR     PROSTATECTOMY       Current Outpatient Medications  Medication Sig Dispense Refill   acetaminophen (TYLENOL) 500 MG tablet Take 500 mg by mouth 2 (two) times daily.     amLODipine (NORVASC) 5 MG tablet Take 1 tablet (5 mg total) by mouth daily. 90 tablet 3   carvedilol (COREG) 12.5 MG tablet Take 1 tablet (12.5 mg total) by mouth 2 (two) times daily with a meal. 90 tablet 2   Cholecalciferol (VITAMIN D) 2000 units tablet Take 2,000 Units by mouth daily.     Coenzyme Q10 (CO Q-10) 100 MG CAPS Take 100 mg by mouth daily.     DULoxetine (CYMBALTA) 20 MG capsule Take 20 mg by mouth daily.     ELIQUIS 5 MG TABS tablet TAKE 1 TABLET TWICE  A DAY 180 tablet 3   GEMTESA 75 MG TABS Take 1 tablet by mouth daily.     Magnesium Citrate 100 MG TABS Take 100 mg by mouth daily.     Misc Natural Products (OSTEO BI-FLEX JOINT SHIELD PO) Take 1 capsule by mouth daily.     Multiple Vitamin (MULTIVITAMIN) capsule Take 1 capsule by mouth daily.     Omega-3 Fatty Acids (FISH OIL PO) Take 800 mg by mouth daily. Flaxseed oil/ 800 mg     spironolactone (ALDACTONE) 25 MG tablet TAKE 1 TABLET DAILY (STOP HYDROCHLOROTHIAZIDE) 90 tablet 3   tamsulosin (FLOMAX) 0.4 MG CAPS capsule Take 0.4 mg by mouth daily after supper.     TURMERIC CURCUMIN PO Take 500 mg by mouth daily.     valsartan (DIOVAN) 320 MG tablet Take  320 mg by mouth daily.     No current facility-administered medications for this visit.    Allergies:   Fesoterodine, Fesoterodine fumarate er, Gabapentin, Hydrocodone-acetaminophen, Mirabegron, Neomycin, Tape, Tramadol hcl, Vicodin [hydrocodone-acetaminophen], Zoster vaccine live, Citric acid, Fluviral [influenza virus vaccine], Influenza vaccines, Sulfa antibiotics, and Vinegar [acetic acid]    Social History:  The patient  reports that he has quit smoking. His smoking use included cigarettes. He has never used smokeless tobacco. He reports that he does not drink alcohol and does not use drugs.   Family History:  The patient's family history includes Heart Problems in his mother; Hypertension in his brother and sister.    ROS:  Please see the history of present illness.   Otherwise, review of systems are positive for none.   All other systems are reviewed and negative.    PHYSICAL EXAM: VS:  BP (!) 152/92 (BP Location: Left Arm, Patient Position: Sitting, Cuff Size: Normal)   Pulse (!) 102   Ht '5\' 11"'$  (1.803 m)   Wt 225 lb 9.6 oz (102.3 kg)   SpO2 98%   BMI 31.46 kg/m  , BMI Body mass index is 31.46 kg/m. GEN: Well nourished, well developed, in no acute distress  HEENT: normal  Neck: no JVD, carotid bruits, or masses Cardiac: Irregularly irregular; no murmurs, rubs, or gallops, mild leg edema  Respiratory:  clear to auscultation bilaterally, normal work of breathing GI: soft, nontender, nondistended, + BS MS: no deformity or atrophy  Skin: warm and dry, no rash Neuro:  Strength and sensation are intact Psych: euthymic mood, full affect   EKG:  EKG is ordered today. The ekg ordered today demonstrates atrial fibrillation with ventricular rate of 102 bpm.  Left anterior fascicular block.   Recent Labs: No results found for requested labs within last 365 days.    Lipid Panel No results found for: "CHOL", "TRIG", "HDL", "CHOLHDL", "VLDL", "LDLCALC", "LDLDIRECT"    Wt  Readings from Last 3 Encounters:  08/31/22 225 lb 9.6 oz (102.3 kg)  07/17/22 220 lb (99.8 kg)  01/05/22 222 lb 8 oz (100.9 kg)          01/11/2018   11:39 AM  PAD Screen  Previous PAD dx? No  Previous surgical procedure? No  Pain with walking? Yes  Subsides with rest? No  Feet/toe relief with dangling? No  Painful, non-healing ulcers? No  Extremities discolored? No      ASSESSMENT AND PLAN:  1.  Permanent atrial fibrillation: He continues to complain of side effects with carvedilol and thus I elected to switch him to metoprolol tartrate 25 mg twice daily.  If symptoms do not improve, we  could consider stopping beta-blockers altogether and switch him to diltiazem.  Continue anticoagulation with Eliquis.  I reviewed his most recent labs done in September which showed normal CBC and basic metabolic profile.  2.  Essential hypertension: His blood pressure is elevated but he does not seem to be tolerating carvedilol very well.  I switch him to metoprolol.  Continue amlodipine, valsartan and spironolactone.  3.  Lower extremity edema: Worse at the end of the day likely due to chronic venous insufficiency.  I recommended leg elevation during the day and if needed knee-high support stockings.   Disposition:   FU with me in 6 months  Signed,  Kathlyn Sacramento, MD  08/31/2022 3:56 PM    [place]

## 2022-09-03 ENCOUNTER — Other Ambulatory Visit: Payer: Self-pay | Admitting: Cardiovascular Disease

## 2022-11-14 ENCOUNTER — Other Ambulatory Visit: Payer: Self-pay | Admitting: *Deleted

## 2022-11-14 MED ORDER — TAMSULOSIN HCL 0.4 MG PO CAPS: 0.4000 mg | ORAL_CAPSULE | Freq: Every day | ORAL | 3 refills | Status: AC

## 2022-12-01 ENCOUNTER — Telehealth: Payer: Self-pay | Admitting: Cardiovascular Disease

## 2022-12-01 MED ORDER — SPIRONOLACTONE 25 MG PO TABS: ORAL_TABLET | ORAL | 3 refills | Status: DC

## 2022-12-01 NOTE — Telephone Encounter (Signed)
*  STAT* If patient is at the pharmacy, call can be transferred to refill team.   1. Which medications need to be refilled? (please list name of each medication and dose if known) spironolactone (ALDACTONE) 25 MG tablet   2. Which pharmacy/location (including street and city if local pharmacy) is medication to be sent to? [facility] - [place], [place] - [address]   3. Do they need a 30 day or 90 day supply? 90

## 2022-12-01 NOTE — Telephone Encounter (Signed)
Refill has been sent to pharmacy. Left detailed voicemail message that refill has been sent in and to call back if any further needs.

## 2022-12-19 ENCOUNTER — Telehealth: Payer: Self-pay | Admitting: Cardiovascular Disease

## 2022-12-19 NOTE — Telephone Encounter (Signed)
Left a message for the patient to call back.  

## 2022-12-19 NOTE — Telephone Encounter (Signed)
Called patient to schedule appointment per Mychart message, no answer, left voicemail to return the call. Patient states in [place] message he has side effects of medication.

## 2022-12-22 NOTE — Telephone Encounter (Signed)
Left a message for the patient to call back if anything was needed.  

## 2023-01-12 ENCOUNTER — Telehealth: Payer: Self-pay | Admitting: Cardiovascular Disease

## 2023-01-12 MED ORDER — VALSARTAN 320 MG PO TABS: 320.0000 mg | ORAL_TABLET | Freq: Every day | ORAL | 1 refills | Status: DC

## 2023-01-12 NOTE — Telephone Encounter (Signed)
*  STAT* If patient is at the pharmacy, call can be transferred to refill team.   1. Which medications need to be refilled? (please list name of each medication and dose if known)   valsartan (DIOVAN) 320 MG tablet     2. Would you like to learn more about the convenience, safety, & potential cost savings by using the [facility]? No   3. Are you open to using the [facility] (Type [facility]. ) No   4. Which pharmacy/location (including street and city if local pharmacy) is medication to be sent to?[facility] - [place], [place] - [address]    5. Do they need a 30 day or 90 day supply? 90 day

## 2023-02-21 ENCOUNTER — Other Ambulatory Visit: Payer: Self-pay | Admitting: Cardiovascular Disease

## 2023-02-28 ENCOUNTER — Telehealth: Payer: Self-pay | Admitting: Cardiovascular Disease

## 2023-02-28 NOTE — Telephone Encounter (Signed)
Patient states that he is returning call to [place]. Requesting call back.

## 2023-02-28 NOTE — Telephone Encounter (Signed)
Yes appt for October needs to be rescheduled.

## 2023-02-28 NOTE — Telephone Encounter (Signed)
Returned the call to the patient. He stated that he was returning a call to [place].

## 2023-03-29 ENCOUNTER — Ambulatory Visit: Payer: Medicare Other | Admitting: Cardiovascular Disease

## 2023-05-23 ENCOUNTER — Encounter: Payer: Self-pay | Admitting: Cardiovascular Disease

## 2023-05-23 ENCOUNTER — Ambulatory Visit: Payer: Medicare Other | Attending: Cardiovascular Disease | Admitting: Cardiovascular Disease

## 2023-05-23 VITALS — BP 150/78 | HR 86 | Ht 71.0 in | Wt 232.8 lb

## 2023-05-23 DIAGNOSIS — I10 Essential (primary) hypertension: Secondary | ICD-10-CM

## 2023-05-23 DIAGNOSIS — I4821 Permanent atrial fibrillation: Secondary | ICD-10-CM

## 2023-05-23 DIAGNOSIS — I872 Venous insufficiency (chronic) (peripheral): Secondary | ICD-10-CM

## 2023-05-23 NOTE — Patient Instructions (Signed)

## 2023-05-23 NOTE — Progress Notes (Signed)
Cardiology Office Note   Date:  05/23/2023   ID:  Ian Larsen, DOB [birth year], MRN [id number]  PCP:  Enid Baas, MD  Cardiologist:   Lorine Bears, MD   Chief Complaint  Patient presents with   Follow-up    6 month follow up, medication reviewed verbally with patient      History of Present Illness: Ian Larsen is a 88 y.o. male who is here today for follow-up visit regarding permanent atrial fibrillation. He has known history of essential hypertension, spinal stenosis, prostate cancer and borderline diabetes.  He is status post cervical spine surgery in 2006.  Renal artery duplex in 2019 showed no evidence of renal artery stenosis. Echocardiogram in December 2021 showed an EF of 55%, mild mitral regurgitation and mild to moderate tricuspid regurgitation. The patient underwent successful cardioversion in January 2022 but went back into atrial fibrillation and has been treated with rate control.  He reported significant fatigue with carvedilol and thus he was switched to metoprolol during last visit.  He did not notice any difference.  His main issue seems to be poor quality sleep as he wakes up 1 or 2 hours after he falls asleep.  By next morning, he takes his medications and then falls asleep again.  He also has polymyalgia rheumatica.  No chest pain, shortness of breath or palpitations.  Past Medical History:  Diagnosis Date   Fusion of spine of cervical region 2005   Hypertension    a. 12/2017 Renal artery duplex: No renal artery stenosis. Nl Celiac and SMA artery findings.   Persistent atrial fibrillation (HCC)    a. 05/2020 Echo: EF 55%, no rwma, nl RV fxn, mild-mod TR, mild MR.   Polymyalgia rheumatica (HCC)    Prostate cancer (HCC) 1999   a. 1999 s/p radical prostatectomy and radical lymph node resections.    Renal cyst, right    a. 12/2017 Renal Duplex: incidental finding of 2.3 x 2.1 cm avascular cystic area in upper pole and 7.2 x 6.2 x 6.5 cm avascular  cyst in upper-posterior portion of right kidney.   Valvular heart disease    a. 05/2020 Echo: EF 55%. Mild-mod TR, mild MR.    Past Surgical History:  Procedure Laterality Date   CARDIOVERSION N/A 07/26/2020   Procedure: CARDIOVERSION;  Surgeon: Iran Ouch, MD;  Location: [facility] ORS;  Service: Cardiovascular;  Laterality: N/A;   CERVICAL FUSION     HERNIA REPAIR     PROSTATECTOMY       Current Outpatient Medications  Medication Sig Dispense Refill   acetaminophen (TYLENOL) 500 MG tablet Take 500 mg by mouth 2 (two) times daily.     amLODipine (NORVASC) 5 MG tablet TAKE 1 TABLET DAILY 90 tablet 2   Cholecalciferol (VITAMIN D) 2000 units tablet Take 2,000 Units by mouth daily.     Coenzyme Q10 (CO Q-10) 100 MG CAPS Take 100 mg by mouth daily.     DULoxetine (CYMBALTA) 20 MG capsule Take 20 mg by mouth daily.     ELIQUIS 5 MG TABS tablet TAKE 1 TABLET TWICE A DAY 180 tablet 3   GEMTESA 75 MG TABS Take 1 tablet by mouth daily.     Magnesium Citrate 100 MG TABS Take 100 mg by mouth daily.     metoprolol tartrate (LOPRESSOR) 25 MG tablet Take 1 tablet (25 mg total) by mouth 2 (two) times daily. 180 tablet 3   Misc Natural Products (OSTEO BI-FLEX JOINT SHIELD PO) Take 1  capsule by mouth daily.     Multiple Vitamin (MULTIVITAMIN) capsule Take 1 capsule by mouth daily.     Omega-3 Fatty Acids (FISH OIL PO) Take 800 mg by mouth daily. Flaxseed oil/ 800 mg     spironolactone (ALDACTONE) 25 MG tablet TAKE 1 TABLET DAILY (STOP HYDROCHLOROTHIAZIDE) 90 tablet 3   tamsulosin (FLOMAX) 0.4 MG CAPS capsule Take 1 capsule (0.4 mg total) by mouth daily after supper. 90 capsule 3   TURMERIC CURCUMIN PO Take 500 mg by mouth daily.     valsartan (DIOVAN) 320 MG tablet Take 1 tablet (320 mg total) by mouth daily. 90 tablet 1   No current facility-administered medications for this visit.    Allergies:   Fesoterodine, Fesoterodine fumarate er, Gabapentin, Hydrocodone-acetaminophen, Mirabegron,  Neomycin, Tape, Tramadol hcl, Vicodin [hydrocodone-acetaminophen], Zoster vaccine live, Citric acid, Fluviral [influenza virus vaccine], Influenza vaccines, Sulfa antibiotics, and Vinegar [acetic acid]    Social History:  The patient  reports that he has quit smoking. His smoking use included cigarettes. He has never used smokeless tobacco. He reports that he does not drink alcohol and does not use drugs.   Family History:  The patient's family history includes Heart Problems in his mother; Hypertension in his brother and sister.    ROS:  Please see the history of present illness.   Otherwise, review of systems are positive for none.   All other systems are reviewed and negative.    PHYSICAL EXAM: VS:  BP (!) 150/78 (BP Location: Left Arm, Patient Position: Sitting, Cuff Size: Normal) Comment: just took his medication  Pulse 86   Ht 5\' 11"  (1.803 m)   Wt 232 lb 12.8 oz (105.6 kg)   SpO2 97%   BMI 32.47 kg/m  , BMI Body mass index is 32.47 kg/m. GEN: Well nourished, well developed, in no acute distress  HEENT: normal  Neck: no JVD, carotid bruits, or masses Cardiac: Irregularly irregular; no murmurs, rubs, or gallops, mild leg edema  Respiratory:  clear to auscultation bilaterally, normal work of breathing GI: soft, nontender, nondistended, + BS MS: no deformity or atrophy  Skin: warm and dry, no rash Neuro:  Strength and sensation are intact Psych: euthymic mood, full affect   EKG:  EKG is ordered today. The ekg ordered today demonstrates:  Atrial fibrillation Left anterior fascicular block Septal infarct , age undetermined    Recent Labs: No results found for requested labs within last 365 days.    Lipid Panel No results found for: "CHOL", "TRIG", "HDL", "CHOLHDL", "VLDL", "LDLCALC", "LDLDIRECT"    Wt Readings from Last 3 Encounters:  05/23/23 232 lb 12.8 oz (105.6 kg)  08/31/22 225 lb 9.6 oz (102.3 kg)  07/17/22 220 lb (99.8 kg)          01/11/2018   11:39  AM  PAD Screen  Previous PAD dx? No  Previous surgical procedure? No  Pain with walking? Yes  Subsides with rest? No  Feet/toe relief with dangling? No  Painful, non-healing ulcers? No  Extremities discolored? No      ASSESSMENT AND PLAN:  1.  Permanent atrial fibrillation: His rate is reasonably controlled with metoprolol.  Continue anticoagulation with Eliquis 5 mg twice daily.  Most recent labs showed normal renal function.   He had no change in fatigue symptoms after he was switched from carvedilol to metoprolol.  At this point, I do not think medications are the issue.  His poor quality sleep at night is the likely culprit.  He is to be more active in the past time and I suggested that he starts a walking program.  2.  Essential hypertension: His blood pressure is mildly elevated here but I reviewed his home blood pressure readings and they are almost always in the normal range and if anything, his blood pressure has been on the low side at home.  Continue same medications for now.    3.  Lower extremity edema: Worse at the end of the day likely due to chronic venous insufficiency.  He does not like using knee-high support stockings because the swelling goes above the knee when he does.  Continue leg elevation.  We could consider switching amlodipine to hydrochlorothiazide but he is already on spironolactone.    Disposition:   FU with me in 6 months  Signed,  Lorine Bears, MD  05/23/2023 9:11 AM    [facility]

## 2023-07-01 ENCOUNTER — Other Ambulatory Visit: Payer: Self-pay | Admitting: Cardiovascular Disease

## 2023-07-03 NOTE — Telephone Encounter (Signed)
 Last office visit: 05/23/23 with plan to f/u in 6 months   Next visit: none/active recall

## 2023-07-18 ENCOUNTER — Ambulatory Visit: Payer: Medicare Other | Admitting: Urology

## 2023-08-03 ENCOUNTER — Telehealth: Payer: Self-pay | Admitting: Cardiovascular Disease

## 2023-08-03 ENCOUNTER — Other Ambulatory Visit: Payer: Self-pay

## 2023-08-03 DIAGNOSIS — I4819 Other persistent atrial fibrillation: Secondary | ICD-10-CM

## 2023-08-03 MED ORDER — APIXABAN 5 MG PO TABS: 5.0000 mg | ORAL_TABLET | Freq: Two times a day (BID) | ORAL | 1 refills | Status: DC

## 2023-08-03 NOTE — Telephone Encounter (Signed)
 RX sent to pharmacy-   Last OV 04/2023: 1. Permanent atrial fibrillation: His rate is reasonably controlled with metoprolol . Continue anticoagulation with Eliquis  5 mg twice daily. Most recent labs showed normal renal function.

## 2023-08-03 NOTE — Telephone Encounter (Signed)
*  STAT* If patient is at the pharmacy, call can be transferred to refill team.   1. Which medications need to be refilled? (please list name of each medication and dose if known) ELIQUIS 5 MG TABS tablet   2. Which pharmacy/location (including street and city if local pharmacy) is medication to be sent to? [facility] [place], [place]   3. Do they need a 30 day or 90 day supply? [place]

## 2023-08-27 ENCOUNTER — Ambulatory Visit: Payer: Medicare Other | Admitting: Urology

## 2023-08-27 VITALS — BP 163/110 | HR 84 | Ht 71.0 in | Wt 232.0 lb

## 2023-08-27 DIAGNOSIS — N3281 Overactive bladder: Secondary | ICD-10-CM | POA: Diagnosis not present

## 2023-08-27 DIAGNOSIS — Z8546 Personal history of malignant neoplasm of prostate: Secondary | ICD-10-CM

## 2023-08-27 DIAGNOSIS — N281 Cyst of kidney, acquired: Secondary | ICD-10-CM

## 2023-08-27 NOTE — Progress Notes (Signed)
 I, Ian Larsen, acting as a scribe for Riki Altes, MD., have documented all relevant documentation on the behalf of Riki Altes, MD, as directed by Riki Altes, MD while in the presence of Riki Altes, MD.  08/27/2023 12:23 PM   Ian Larsen Mar 21, 1935 [id number]  Referring provider: Enid Baas, MD [address] [place],  [place] [address]  Urologic History: 1. History of prostate cancer Radical prostatectomy by Dr. Evelene Croon 1999   2. Overactive bladder Prior cystoscopy showed no evidence of bladder contracture or bladder mucosal abnormalities   HPI: Ian Larsen is a 89 y.o. male presents for annual follow-up.  No significant changes in his urologic history since the last visit. Denies gross hematuria No flank, abdominal, or pelvic pain   PMH: Past Medical History:  Diagnosis Date   Fusion of spine of cervical region 2005   Hypertension    a. 12/2017 Renal artery duplex: No renal artery stenosis. Nl Celiac and SMA artery findings.   Persistent atrial fibrillation (HCC)    a. 05/2020 Echo: EF 55%, no rwma, nl RV fxn, mild-mod TR, mild MR.   Polymyalgia rheumatica (HCC)    Prostate cancer (HCC) 1999   a. 1999 s/p radical prostatectomy and radical lymph node resections.    Renal cyst, right    a. 12/2017 Renal Duplex: incidental finding of 2.3 x 2.1 cm avascular cystic area in upper pole and 7.2 x 6.2 x 6.5 cm avascular cyst in upper-posterior portion of right kidney.   Valvular heart disease    a. 05/2020 Echo: EF 55%. Mild-mod TR, mild MR.    Surgical History: Past Surgical History:  Procedure Laterality Date   CARDIOVERSION N/A 07/26/2020   Procedure: CARDIOVERSION;  Surgeon: Iran Ouch, MD;  Location: [facility] ORS;  Service: Cardiovascular;  Laterality: N/A;   CERVICAL FUSION     HERNIA REPAIR     PROSTATECTOMY      Home Medications:  Allergies as of 08/27/2023       Reactions   Fesoterodine Other (See Comments)   Elevate  blood pressure   Fesoterodine Fumarate Er Other (See Comments)   Elevate blood pressure   Gabapentin Itching   Other reaction(s): itching   Hydrocodone-acetaminophen    Other reaction(s): dizzy   Mirabegron Other (See Comments)   Elevate blood pressure   Neomycin Other (See Comments)   Other reaction(s): Other (See Comments), rash rash   Tape Dermatitis   Please use paper tape   Tramadol Hcl Nausea Only   Dizzy   Vicodin [hydrocodone-acetaminophen] Nausea And Vomiting   Patient states this made him dizzy and felt horrible.   Zoster Vaccine Live Other (See Comments)   Patient does not wish to take because of his neomycin allergy Other reaction(s): patient does not wish to take because of his neomycin allergy   Citric Acid Rash   Fluviral [influenza Virus Vaccine] Rash   Elevated temperature   Influenza Vaccines Rash   Other reaction(s): hyperthermia    Sulfa Antibiotics Rash   Vinegar [acetic Acid] Rash        Medication List        Accurate as of August 27, 2023 12:23 PM. If you have any questions, ask your nurse or doctor.          acetaminophen 500 MG tablet Commonly known as: TYLENOL Take 500 mg by mouth 2 (two) times daily.   amLODipine 5 MG tablet Commonly known as: NORVASC TAKE 1 TABLET  DAILY   apixaban 5 MG Tabs tablet Commonly known as: Eliquis Take 1 tablet (5 mg total) by mouth 2 (two) times daily.   Co Q-10 100 MG Caps Take 100 mg by mouth daily.   DULoxetine 20 MG capsule Commonly known as: CYMBALTA Take 20 mg by mouth daily.   FISH OIL PO Take 800 mg by mouth daily. Flaxseed oil/ 800 mg   Gemtesa 75 MG Tabs Generic drug: Vibegron Take 1 tablet by mouth daily.   Magnesium Citrate 100 MG Tabs Take 100 mg by mouth daily.   metoprolol tartrate 25 MG tablet Commonly known as: LOPRESSOR Take 1 tablet (25 mg total) by mouth 2 (two) times daily.   multivitamin capsule Take 1 capsule by mouth daily.   OSTEO BI-FLEX JOINT SHIELD PO Take  1 capsule by mouth daily.   spironolactone 25 MG tablet Commonly known as: ALDACTONE TAKE 1 TABLET DAILY (STOP HYDROCHLOROTHIAZIDE)   tamsulosin 0.4 MG Caps capsule Commonly known as: FLOMAX Take 1 capsule (0.4 mg total) by mouth daily after supper.   TURMERIC CURCUMIN PO Take 500 mg by mouth daily.   valsartan 320 MG tablet Commonly known as: DIOVAN TAKE 1 TABLET DAILY   Vitamin D 50 MCG (2000 UT) tablet Take 2,000 Units by mouth daily.        Allergies:  Allergies  Allergen Reactions   Fesoterodine Other (See Comments)    Elevate blood pressure   Fesoterodine Fumarate Er Other (See Comments)    Elevate blood pressure   Gabapentin Itching    Other reaction(s): itching   Hydrocodone-Acetaminophen     Other reaction(s): dizzy   Mirabegron Other (See Comments)    Elevate blood pressure   Neomycin Other (See Comments)    Other reaction(s): Other (See Comments), rash rash    Tape Dermatitis    Please use paper tape   Tramadol Hcl Nausea Only    Dizzy   Vicodin [Hydrocodone-Acetaminophen] Nausea And Vomiting    Patient states this made him dizzy and felt horrible.   Zoster Vaccine Live Other (See Comments)    Patient does not wish to take because of his neomycin allergy Other reaction(s): patient does not wish to take because of his neomycin allergy   Citric Acid Rash   Fluviral [Influenza Virus Vaccine] Rash    Elevated temperature   Influenza Vaccines Rash    Other reaction(s): hyperthermia    Sulfa Antibiotics Rash   Vinegar [Acetic Acid] Rash    Family History: Family History  Problem Relation Age of Onset   Heart Problems Mother    Hypertension Sister    Hypertension Brother     Social History:  reports that he has quit smoking. His smoking use included cigarettes. He has never used smokeless tobacco. He reports that he does not drink alcohol and does not use drugs.   Physical Exam: BP (!) 163/110   Pulse 84   Ht 5\' 11"  (1.803 m)   Wt 232 lb  (105.2 kg)   BMI 32.36 kg/m   Constitutional:  Alert and oriented, No acute distress. HEENT: [place] AT, moist mucus membranes.  Trachea midline, no masses. Cardiovascular: No clubbing, cyanosis, or edema. Respiratory: Normal respiratory effort, no increased work of breathing. GI: Abdomen is soft, nontender, nondistended, no abdominal masses Skin: No rashes, bruises or suspicious lesions. Neurologic: Grossly intact, no focal deficits, moving all 4 extremities. Psychiatric: Normal mood and affect.   Assessment & Plan:    1. History of prostate cancer  His PSA last year was undetectable and recommended discontinuing PSA checks based on time out from surgery.  He did request his PSA be drawn today.   2. Overactive bladder Stable.  He will continue annual follow-up.  [facility] [address], [address] [place], [place] [address] [phone]

## 2023-08-28 ENCOUNTER — Encounter: Payer: Self-pay | Admitting: Urology

## 2023-08-28 LAB — PSA: Prostate Specific Ag, Serum: <0.1 ng/mL (ref 0.0–4.0)

## 2023-08-29 ENCOUNTER — Encounter: Payer: Self-pay | Admitting: Urology

## 2023-11-20 ENCOUNTER — Encounter: Payer: Self-pay | Admitting: Cardiovascular Disease

## 2023-11-20 ENCOUNTER — Ambulatory Visit: Payer: Medicare Other | Admitting: Cardiovascular Disease

## 2023-11-20 ENCOUNTER — Ambulatory Visit: Attending: Cardiovascular Disease | Admitting: Cardiovascular Disease

## 2023-11-20 VITALS — BP 140/78 | HR 111 | Ht 71.0 in | Wt 231.8 lb

## 2023-11-20 DIAGNOSIS — I10 Essential (primary) hypertension: Secondary | ICD-10-CM

## 2023-11-20 DIAGNOSIS — I4821 Permanent atrial fibrillation: Secondary | ICD-10-CM

## 2023-11-20 NOTE — Progress Notes (Signed)
 Cardiology Office Note   Date:  11/20/2023   ID:  Ian Larsen, DOB Apr 03, 1935, MRN [id number]  PCP:  Rex Castor, MD  Cardiologist:   Antionette Kirks, MD   No chief complaint on file.     History of Present Illness: Ian Larsen is a 89 y.o. male who is here today for follow-up visit regarding permanent atrial fibrillation. He has known history of essential hypertension, spinal stenosis, prostate cancer and borderline diabetes.  He is status post cervical spine surgery in 2006.  Renal artery duplex in 2019 showed no evidence of renal artery stenosis. Echocardiogram in December 2021 showed an EF of 55%, mild mitral regurgitation and mild to moderate tricuspid regurgitation. The patient underwent successful cardioversion in January 2022 but went back into atrial fibrillation and has been treated with rate control.  He reported significant fatigue with carvedilol  and thus he was switched to metoprolol .  He did not notice any difference.  His main issue seems to be poor quality sleep as he wakes up 1 or 2 hours after he falls asleep.    He has been doing reasonably well with no chest pain or worsening dyspnea.  He reports improvement in fatigue.  He is mildly tachycardic today but he was rushing to the appointment.  He reports that his heart rate is between 60 and 80 bpm at home.  Past Medical History:  Diagnosis Date   Fusion of spine of cervical region 2005   Hypertension    a. 12/2017 Renal artery duplex: No renal artery stenosis. Nl Celiac and SMA artery findings.   Persistent atrial fibrillation (HCC)    a. 05/2020 Echo: EF 55%, no rwma, nl RV fxn, mild-mod TR, mild MR.   Polymyalgia rheumatica (HCC)    Prostate cancer (HCC) 1999   a. 1999 s/p radical prostatectomy and radical lymph node resections.    Renal cyst, right    a. 12/2017 Renal Duplex: incidental finding of 2.3 x 2.1 cm avascular cystic area in upper pole and 7.2 x 6.2 x 6.5 cm avascular cyst in  upper-posterior portion of right kidney.   Valvular heart disease    a. 05/2020 Echo: EF 55%. Mild-mod TR, mild MR.    Past Surgical History:  Procedure Laterality Date   CARDIOVERSION N/A 07/26/2020   Procedure: CARDIOVERSION;  Surgeon: Wenona Hamilton, MD;  Location: [facility] ORS;  Service: Cardiovascular;  Laterality: N/A;   CERVICAL FUSION     HERNIA REPAIR     PROSTATECTOMY       Current Outpatient Medications  Medication Sig Dispense Refill   acetaminophen (TYLENOL) 500 MG tablet Take 500 mg by mouth 2 (two) times daily.     amLODipine  (NORVASC ) 5 MG tablet TAKE 1 TABLET DAILY 90 tablet 1   apixaban  (ELIQUIS ) 5 MG TABS tablet Take 1 tablet (5 mg total) by mouth 2 (two) times daily. 180 tablet 1   Cholecalciferol (VITAMIN D) 2000 units tablet Take 2,000 Units by mouth daily.     Coenzyme Q10 (CO Q-10) 100 MG CAPS Take 100 mg by mouth daily.     DULoxetine (CYMBALTA) 20 MG capsule Take 20 mg by mouth daily.     GEMTESA 75 MG TABS Take 1 tablet by mouth daily.     Magnesium Citrate 100 MG TABS Take 100 mg by mouth daily.     metoprolol  tartrate (LOPRESSOR ) 25 MG tablet Take 1 tablet (25 mg total) by mouth 2 (two) times daily. 180 tablet 3   Misc  Natural Products (OSTEO BI-FLEX JOINT SHIELD PO) Take 1 capsule by mouth daily.     Multiple Vitamin (MULTIVITAMIN) capsule Take 1 capsule by mouth daily.     Omega-3 Fatty Acids (FISH OIL PO) Take 800 mg by mouth daily. Flaxseed oil/ 800 mg     spironolactone  (ALDACTONE ) 25 MG tablet TAKE 1 TABLET DAILY (STOP HYDROCHLOROTHIAZIDE) 90 tablet 3   tamsulosin  (FLOMAX ) 0.4 MG CAPS capsule Take 1 capsule (0.4 mg total) by mouth daily after supper. 90 capsule 3   TURMERIC CURCUMIN PO Take 500 mg by mouth daily.     valsartan  (DIOVAN ) 320 MG tablet TAKE 1 TABLET DAILY 90 tablet 1   No current facility-administered medications for this visit.    Allergies:   Fesoterodine, Fesoterodine fumarate er, Gabapentin, Hydrocodone-acetaminophen, Mirabegron,  Neomycin, Tape, Tramadol hcl, Vicodin [hydrocodone-acetaminophen], Zoster vaccine live, Citric acid, Fluviral [influenza virus vaccine], Influenza vaccines, Sulfa antibiotics, and Vinegar [acetic acid]    Social History:  The patient  reports that he has quit smoking. His smoking use included cigarettes. He has never used smokeless tobacco. He reports that he does not drink alcohol and does not use drugs.   Family History:  The patient's family history includes Heart Problems in his mother; Hypertension in his brother and sister.    ROS:  Please see the history of present illness.   Otherwise, review of systems are positive for none.   All other systems are reviewed and negative.    PHYSICAL EXAM: VS:  BP (!) 140/78 (BP Location: Left Arm, Patient Position: Sitting, Cuff Size: Normal)   Pulse (!) 111   Ht 5\' 11"  (1.803 m)   Wt 231 lb 12.8 oz (105.1 kg)   BMI 32.33 kg/m  , BMI Body mass index is 32.33 kg/m. GEN: Well nourished, well developed, in no acute distress  HEENT: normal  Neck: no JVD, carotid bruits, or masses Cardiac: Irregularly irregular; no murmurs, rubs, or gallops, mild leg edema  Respiratory:  clear to auscultation bilaterally, normal work of breathing GI: soft, nontender, nondistended, + BS MS: no deformity or atrophy  Skin: warm and dry, no rash Neuro:  Strength and sensation are intact Psych: euthymic mood, full affect   EKG:  EKG is ordered today. The ekg ordered today demonstrates: Atrial fibrillation with rapid ventricular response Left anterior fascicular block When compared with ECG of 23-May-2023 09:01, No significant change was found     Recent Labs: No results found for requested labs within last 365 days.    Lipid Panel No results found for: "CHOL", "TRIG", "HDL", "CHOLHDL", "VLDL", "LDLCALC", "LDLDIRECT"    Wt Readings from Last 3 Encounters:  11/20/23 231 lb 12.8 oz (105.1 kg)  08/27/23 232 lb (105.2 kg)  05/23/23 232 lb 12.8 oz (105.6  kg)          01/11/2018   11:39 AM  PAD Screen  Previous PAD dx? No  Previous surgical procedure? No  Pain with walking? Yes  Subsides with rest? No  Feet/toe relief with dangling? No  Painful, non-healing ulcers? No  Extremities discolored? No      ASSESSMENT AND PLAN:  1.  Permanent atrial fibrillation: He is mildly tachycardic today but his heart rate is in the controlled range at home.  He was rushing to the appointment and prefers not to make any change in his medications especially that he feels very fatigued with antihypertensive medications in the morning.   I made no changes today.  If ventricular rate continues  to be on the high side, will consider increasing metoprolol  to 50 mg twice daily and stopping amlodipine . Continue anticoagulation with Eliquis .  He is going for a physical with his primary care physician in the near future.  2.  Essential hypertension: Blood pressure is reasonably controlled.  3.  Lower extremity edema: Likely due to chronic venous insufficiency and treatment with amlodipine .  This seems to be improved compared to before.     Disposition:   FU  in 6 months  Signed,  Antionette Kirks, MD  11/20/2023 3:20 PM    [facility]

## 2023-11-20 NOTE — Patient Instructions (Signed)
 Medication Instructions:  No changes *If you need a refill on your cardiac medications before your next appointment, please call your pharmacy*  Lab Work: None ordered If you have labs (blood work) drawn today and your tests are completely normal, you will receive your results only by: MyChart Message (if you have MyChart) OR A paper copy in the mail If you have any lab test that is abnormal or we need to change your treatment, we will call you to review the results.  Testing/Procedures: None ordered  Follow-Up: At [facility], you and your health needs are our priority.  As part of our continuing mission to provide you with exceptional heart care, our providers are all part of one team.  This team includes your primary Cardiologist (physician) and Advanced Practice Providers or APPs (Physician Assistants and Nurse Practitioners) who all work together to provide you with the care you need, when you need it.  Your next appointment:   6 month(s)  Provider:   You will see one of the following Advanced Practice Providers on your designated Care Team:   Laneta Pintos, NP Gildardo Labrador, PA-C Varney Gentleman, PA-C Cadence St. Helen, PA-C Ronald Cockayne, NP Morey Ar, NP

## 2023-11-25 ENCOUNTER — Other Ambulatory Visit: Payer: Self-pay | Admitting: Cardiovascular Disease

## 2024-01-03 ENCOUNTER — Other Ambulatory Visit: Payer: Self-pay | Admitting: Cardiovascular Disease

## 2024-01-08 ENCOUNTER — Other Ambulatory Visit: Payer: Self-pay | Admitting: Cardiovascular Disease

## 2024-02-11 ENCOUNTER — Other Ambulatory Visit: Payer: Self-pay | Admitting: Cardiovascular Disease

## 2024-02-11 DIAGNOSIS — I4819 Other persistent atrial fibrillation: Secondary | ICD-10-CM

## 2024-02-11 NOTE — Telephone Encounter (Signed)
 Prescription refill request for Eliquis  received. Indication:AFIB Last office visit:5/25 Scr:1.0  9/24 Age: 89 Weight:105.1  KG  PRESCRIPTION REFILLED

## 2024-03-01 ENCOUNTER — Other Ambulatory Visit: Payer: Self-pay | Admitting: Cardiovascular Disease

## 2024-05-28 ENCOUNTER — Other Ambulatory Visit: Payer: Self-pay | Admitting: Cardiovascular Disease

## 2024-05-30 ENCOUNTER — Encounter: Payer: Self-pay | Admitting: Cardiovascular Disease

## 2024-05-30 ENCOUNTER — Ambulatory Visit: Attending: Cardiovascular Disease | Admitting: Cardiovascular Disease

## 2024-05-30 VITALS — BP 130/90 | HR 93 | Ht 71.0 in | Wt 231.2 lb

## 2024-05-30 DIAGNOSIS — I10 Essential (primary) hypertension: Secondary | ICD-10-CM | POA: Diagnosis not present

## 2024-05-30 DIAGNOSIS — I4821 Permanent atrial fibrillation: Secondary | ICD-10-CM | POA: Diagnosis not present

## 2024-05-30 NOTE — Progress Notes (Signed)
 Cardiology Office Note   Date:  05/30/2024   ID:  Tori Cupps, DOB 1935/01/06, MRN [id number]  PCP:  Sherial Bail, MD  Cardiologist:   Deatrice Cage, MD   Chief Complaint  Patient presents with   Follow-up    6 month f/u c/o BP issue. Meds reviewed verbally with pt.      History of Present Illness: Ian Larsen is a 89 y.o. male who is here today for follow-up visit regarding permanent atrial fibrillation. He has known history of essential hypertension, spinal stenosis, prostate cancer and borderline diabetes.  He is status post cervical spine surgery in 2006.  Renal artery duplex in 2019 showed no evidence of renal artery stenosis. Echocardiogram in December 2021 showed an EF of 55%, mild mitral regurgitation and mild to moderate tricuspid regurgitation. The patient underwent successful cardioversion in January 2022 but went back into atrial fibrillation and has been treated with rate control.  He reported significant fatigue with carvedilol  and thus he was switched to metoprolol .  He did not notice any difference.   He had issues with elevated blood pressure recently but he started checking his blood pressure at home and brought readings with him.  His systolic blood pressure at home ranges from 120 to 140 mmHg with heart rate mostly in the 80s.  He has been doing reasonably well with no chest pain or worsening dyspnea.  No palpitations.   Past Medical History:  Diagnosis Date   Fusion of spine of cervical region 2005   Hypertension    a. 12/2017 Renal artery duplex: No renal artery stenosis. Nl Celiac and SMA artery findings.   Persistent atrial fibrillation (HCC)    a. 05/2020 Echo: EF 55%, no rwma, nl RV fxn, mild-mod TR, mild MR.   Polymyalgia rheumatica    Prostate cancer (HCC) 1999   a. 1999 s/p radical prostatectomy and radical lymph node resections.    Renal cyst, right    a. 12/2017 Renal Duplex: incidental finding of 2.3 x 2.1 cm avascular cystic area  in upper pole and 7.2 x 6.2 x 6.5 cm avascular cyst in upper-posterior portion of right kidney.   Valvular heart disease    a. 05/2020 Echo: EF 55%. Mild-mod TR, mild MR.    Past Surgical History:  Procedure Laterality Date   CARDIOVERSION N/A 07/26/2020   Procedure: CARDIOVERSION;  Surgeon: Cage Deatrice LABOR, MD;  Location: [facility] ORS;  Service: Cardiovascular;  Laterality: N/A;   CERVICAL FUSION     HERNIA REPAIR     PROSTATECTOMY       Current Outpatient Medications  Medication Sig Dispense Refill   acetaminophen (TYLENOL) 500 MG tablet Take 500 mg by mouth 2 (two) times daily.     amLODipine  (NORVASC ) 5 MG tablet TAKE 1 TABLET DAILY 90 tablet 3   Cholecalciferol (VITAMIN D) 2000 units tablet Take 2,000 Units by mouth daily.     Coenzyme Q10 (CO Q-10) 100 MG CAPS Take 100 mg by mouth daily.     DULoxetine (CYMBALTA) 20 MG capsule Take 20 mg by mouth daily.     ELIQUIS  5 MG TABS tablet TAKE 1 TABLET TWICE A DAY 180 tablet 3   Magnesium Citrate 100 MG TABS Take 100 mg by mouth daily.     metoprolol  tartrate (LOPRESSOR ) 25 MG tablet TAKE 1 TABLET TWICE A DAY 180 tablet 3   Misc Natural Products (OSTEO BI-FLEX JOINT SHIELD PO) Take 1 capsule by mouth daily.     Multiple Vitamin (  MULTIVITAMIN) capsule Take 1 capsule by mouth daily.     Omega-3 Fatty Acids (FISH OIL PO) Take 800 mg by mouth daily. Flaxseed oil/ 800 mg     spironolactone  (ALDACTONE ) 25 MG tablet TAKE 1 TABLET DAILY 90 tablet 0   TURMERIC CURCUMIN PO Take 500 mg by mouth daily.     valsartan  (DIOVAN ) 320 MG tablet TAKE 1 TABLET DAILY 90 tablet 3   GEMTESA 75 MG TABS Take 1 tablet by mouth daily. (Patient not taking: Reported on 05/30/2024)     tamsulosin  (FLOMAX ) 0.4 MG CAPS capsule Take 1 capsule (0.4 mg total) by mouth daily after supper. (Patient not taking: Reported on 05/30/2024) 90 capsule 3   No current facility-administered medications for this visit.    Allergies:   Fesoterodine, Fesoterodine fumarate er,  Gabapentin, Hydrocodone-acetaminophen, Mirabegron, Neomycin, Tape, Tramadol hcl, Vicodin [hydrocodone-acetaminophen], Zoster vaccine live, Citric acid, Fluviral [influenza virus vaccine], Influenza vaccines, Sulfa antibiotics, and Vinegar [acetic acid]    Social History:  The patient  reports that he has quit smoking. His smoking use included cigarettes. He has never used smokeless tobacco. He reports that he does not drink alcohol and does not use drugs.   Family History:  The patient's family history includes Heart Problems in his mother; Hypertension in his brother and sister.    ROS:  Please see the history of present illness.   Otherwise, review of systems are positive for none.   All other systems are reviewed and negative.    PHYSICAL EXAM: VS:  BP (!) 130/90 (BP Location: Left Arm, Patient Position: Sitting, Cuff Size: Large)   Pulse 93   Ht 5' 11 (1.803 m)   Wt 231 lb 4 oz (104.9 kg)   SpO2 98%   BMI 32.25 kg/m  , BMI Body mass index is 32.25 kg/m. GEN: Well nourished, well developed, in no acute distress  HEENT: normal  Neck: no JVD, carotid bruits, or masses Cardiac: Irregularly irregular; no murmurs, rubs, or gallops, mild leg edema  Respiratory:  clear to auscultation bilaterally, normal work of breathing GI: soft, nontender, nondistended, + BS MS: no deformity or atrophy  Skin: warm and dry, no rash Neuro:  Strength and sensation are intact Psych: euthymic mood, full affect   EKG:  EKG is ordered today. The ekg ordered today demonstrates: Atrial fibrillation Left anterior fascicular block When compared with ECG of 20-Nov-2023 15:03, No significant change was found     Recent Labs: No results found for requested labs within last 365 days.    Lipid Panel No results found for: CHOL, TRIG, HDL, CHOLHDL, VLDL, LDLCALC, LDLDIRECT    Wt Readings from Last 3 Encounters:  05/30/24 231 lb 4 oz (104.9 kg)  11/20/23 231 lb 12.8 oz (105.1 kg)   08/27/23 232 lb (105.2 kg)          01/11/2018   11:39 AM  PAD Screen  Previous PAD dx? No  Previous surgical procedure? No  Pain with walking? Yes  Subsides with rest? No  Feet/toe relief with dangling? No  Painful, non-healing ulcers? No  Extremities discolored? No      ASSESSMENT AND PLAN:  1.  Permanent atrial fibrillation: Ventricular rate seems to be reasonably controlled on metoprolol  25 mg twice daily which will be continued.  He is tolerating anticoagulation with Eliquis  with no bleeding side effects.  I reviewed his recent labs done in October which showed no evidence of anemia.  His creatinine was 1.1.  2.  Essential  hypertension: Intermittently high blood pressure in the office but his blood pressure at home seems to be well-controlled.  He is on multiple antihypertensive medications including metoprolol , valsartan , spironolactone  and amlodipine .  I elected not to make any changes.  3.  Lower extremity edema: Likely due to chronic venous insufficiency and treatment with amlodipine .  This seems to be improved compared to before.     Disposition:   FU  in 12 months  Signed,  Deatrice Cage, MD  05/30/2024 10:52 AM    [facility]

## 2024-05-30 NOTE — Patient Instructions (Signed)

## 2024-08-27 ENCOUNTER — Ambulatory Visit: Admitting: Urology
